# Patient Record
Sex: Female | Born: 1975
Health system: Southern US, Community
[De-identification: ages and names within clinical notes are randomized; demographics above are authoritative.]

## PROBLEM LIST (undated history)

## (undated) DIAGNOSIS — Z309 Encounter for contraceptive management, unspecified: Secondary | ICD-10-CM

## (undated) DIAGNOSIS — N390 Urinary tract infection, site not specified: Secondary | ICD-10-CM

## (undated) DIAGNOSIS — B001 Herpesviral vesicular dermatitis: Secondary | ICD-10-CM

## (undated) DIAGNOSIS — Z9889 Other specified postprocedural states: Secondary | ICD-10-CM

## (undated) DIAGNOSIS — R112 Nausea with vomiting, unspecified: Secondary | ICD-10-CM

## (undated) DIAGNOSIS — G43909 Migraine, unspecified, not intractable, without status migrainosus: Secondary | ICD-10-CM

## (undated) DIAGNOSIS — N6310 Unspecified lump in the right breast, unspecified quadrant: Secondary | ICD-10-CM

## (undated) HISTORY — PX: BREAST BIOPSY: SHX20

## (undated) HISTORY — DX: Encounter for contraceptive management, unspecified: Z30.9

## (undated) HISTORY — DX: Urinary tract infection, site not specified: N39.0

## (undated) HISTORY — DX: Migraine, unspecified, not intractable, without status migrainosus: G43.909

## (undated) HISTORY — DX: Herpesviral vesicular dermatitis: B00.1

## (undated) HISTORY — DX: Unspecified lump in the right breast, unspecified quadrant: N63.10

---

## 2008-05-23 ENCOUNTER — Other Ambulatory Visit: Admission: RE | Admit: 2008-05-23 | Discharge: 2008-05-23 | Payer: Self-pay | Admitting: Obstetrics and Gynecology

## 2009-01-17 DIAGNOSIS — C4491 Basal cell carcinoma of skin, unspecified: Secondary | ICD-10-CM

## 2009-01-17 HISTORY — DX: Basal cell carcinoma of skin, unspecified: C44.91

## 2009-07-13 ENCOUNTER — Other Ambulatory Visit: Admission: RE | Admit: 2009-07-13 | Discharge: 2009-07-13 | Payer: Self-pay | Admitting: Obstetrics and Gynecology

## 2009-07-14 ENCOUNTER — Encounter: Admission: RE | Admit: 2009-07-14 | Discharge: 2009-07-14 | Payer: Self-pay | Admitting: Obstetrics & Gynecology

## 2009-07-17 ENCOUNTER — Encounter: Admission: RE | Admit: 2009-07-17 | Discharge: 2009-07-17 | Payer: Self-pay | Admitting: Obstetrics & Gynecology

## 2010-01-08 ENCOUNTER — Encounter: Admission: RE | Admit: 2010-01-08 | Discharge: 2010-01-08 | Payer: Self-pay | Admitting: Obstetrics & Gynecology

## 2010-08-27 ENCOUNTER — Other Ambulatory Visit: Admission: RE | Admit: 2010-08-27 | Discharge: 2010-08-27 | Payer: Self-pay | Admitting: Obstetrics and Gynecology

## 2011-09-19 ENCOUNTER — Other Ambulatory Visit (HOSPITAL_COMMUNITY)
Admission: RE | Admit: 2011-09-19 | Discharge: 2011-09-19 | Disposition: A | Discharge status: Home / Self Care | Payer: BC Managed Care – PPO | Source: Ambulatory Visit | Attending: Obstetrics and Gynecology | Admitting: Obstetrics and Gynecology

## 2011-09-19 ENCOUNTER — Other Ambulatory Visit: Payer: Self-pay | Admitting: Obstetrics & Gynecology

## 2011-09-19 ENCOUNTER — Other Ambulatory Visit: Payer: Self-pay | Admitting: Adult Health

## 2011-09-19 DIAGNOSIS — Z01419 Encounter for gynecological examination (general) (routine) without abnormal findings: Secondary | ICD-10-CM | POA: Insufficient documentation

## 2011-09-19 DIAGNOSIS — N63 Unspecified lump in unspecified breast: Secondary | ICD-10-CM

## 2011-10-07 ENCOUNTER — Other Ambulatory Visit: Payer: Self-pay

## 2011-10-22 ENCOUNTER — Ambulatory Visit
Admission: RE | Admit: 2011-10-22 | Discharge: 2011-10-22 | Disposition: A | Discharge status: Home / Self Care | Payer: BC Managed Care – PPO | Source: Ambulatory Visit | Attending: Obstetrics & Gynecology | Admitting: Obstetrics & Gynecology

## 2011-10-22 DIAGNOSIS — N63 Unspecified lump in unspecified breast: Secondary | ICD-10-CM

## 2012-10-13 ENCOUNTER — Other Ambulatory Visit: Payer: Self-pay | Admitting: Adult Health

## 2012-10-13 ENCOUNTER — Other Ambulatory Visit (HOSPITAL_COMMUNITY)
Admission: RE | Admit: 2012-10-13 | Discharge: 2012-10-13 | Disposition: A | Discharge status: Home / Self Care | Payer: BC Managed Care – PPO | Source: Ambulatory Visit | Attending: Obstetrics and Gynecology | Admitting: Obstetrics and Gynecology

## 2012-10-13 DIAGNOSIS — Z01419 Encounter for gynecological examination (general) (routine) without abnormal findings: Secondary | ICD-10-CM | POA: Insufficient documentation

## 2012-10-13 DIAGNOSIS — Z1151 Encounter for screening for human papillomavirus (HPV): Secondary | ICD-10-CM | POA: Insufficient documentation

## 2013-04-21 ENCOUNTER — Telehealth: Payer: Self-pay | Admitting: Adult Health

## 2013-04-21 MED ORDER — NORETHINDRONE 0.35 MG PO TABS: 1.0000 | ORAL_TABLET | Freq: Every day | ORAL | Status: DC

## 2013-04-21 NOTE — Telephone Encounter (Signed)
Will refill camilla

## 2013-07-08 DIAGNOSIS — C4491 Basal cell carcinoma of skin, unspecified: Secondary | ICD-10-CM

## 2013-07-08 HISTORY — DX: Basal cell carcinoma of skin, unspecified: C44.91

## 2013-08-24 ENCOUNTER — Ambulatory Visit (INDEPENDENT_AMBULATORY_CARE_PROVIDER_SITE_OTHER): Payer: BC Managed Care – PPO | Admitting: Adult Health

## 2013-08-24 ENCOUNTER — Encounter: Payer: Self-pay | Admitting: Adult Health

## 2013-08-24 VITALS — BP 110/70 | Ht 65.0 in | Wt 114.0 lb

## 2013-08-24 DIAGNOSIS — N63 Unspecified lump in unspecified breast: Secondary | ICD-10-CM

## 2013-08-24 DIAGNOSIS — N6310 Unspecified lump in the right breast, unspecified quadrant: Secondary | ICD-10-CM | POA: Insufficient documentation

## 2013-08-24 HISTORY — DX: Unspecified lump in the right breast, unspecified quadrant: N63.10

## 2013-08-24 NOTE — Patient Instructions (Addendum)
Follow up prn Get mammogram and [place]

## 2013-08-24 NOTE — Progress Notes (Signed)
Subjective:     Patient ID: Kathleen Hughes, female   DOB: [birth year], 37 y.o.   MRN: [id number]  HPI Kathleen Hughes has noticed a right breast mass x about 3 weeks.  Review of Systems Positives in HPI Reviewed past medical,surgical, social and family history. Reviewed medications and allergies.     Objective:   Physical Exam BP 110/70  Ht 5\' 5"  (1.651 m)  Wt 114 lb (51.71 kg)  BMI 18.97 kg/m2    Skin warm and dry,  Breasts:no dominate palpable mass, retraction or nipple discharge on the left, on the right there is a round mass at 12-1 o'clock 2 finger breath from areola and 1 at 3 o'clock 1 finger breath from areola,no retraction or nipple discharge.  Assessment:     Right breast mass x 2    Plan:     Bilateral diagnostic mammogram and right breast US at the breast center 9/18 at 8:30 am Follow up prn

## 2013-09-02 ENCOUNTER — Ambulatory Visit
Admission: RE | Admit: 2013-09-02 | Discharge: 2013-09-02 | Disposition: A | Discharge status: Home / Self Care | Payer: BC Managed Care – PPO | Source: Ambulatory Visit | Attending: Adult Health | Admitting: Adult Health

## 2013-09-02 ENCOUNTER — Other Ambulatory Visit: Payer: Self-pay | Admitting: Adult Health

## 2013-09-02 DIAGNOSIS — N6310 Unspecified lump in the right breast, unspecified quadrant: Secondary | ICD-10-CM

## 2013-09-06 ENCOUNTER — Telehealth: Payer: Self-pay | Admitting: Adult Health

## 2013-09-06 NOTE — Telephone Encounter (Signed)
Left message that I was aware that breast biopsy was fibroadenoma

## 2013-10-12 ENCOUNTER — Telehealth: Payer: Self-pay | Admitting: Obstetrics and Gynecology

## 2013-10-12 NOTE — Telephone Encounter (Signed)
She is fine to go 2-3 years on pap and get mail order to fax

## 2014-01-19 ENCOUNTER — Other Ambulatory Visit: Payer: Self-pay | Admitting: Physician Assistant

## 2014-01-19 DIAGNOSIS — D229 Melanocytic nevi, unspecified: Secondary | ICD-10-CM

## 2014-01-19 HISTORY — DX: Melanocytic nevi, unspecified: D22.9

## 2014-04-13 ENCOUNTER — Telehealth: Payer: Self-pay | Admitting: Adult Health

## 2014-04-14 ENCOUNTER — Other Ambulatory Visit: Payer: Self-pay | Admitting: *Deleted

## 2014-04-14 MED ORDER — NORETHINDRONE 0.35 MG PO TABS: 1.0000 | ORAL_TABLET | Freq: Every day | ORAL | Status: DC

## 2014-04-14 NOTE — Telephone Encounter (Signed)
Pt requesting refill on [employer]ron[employer].

## 2014-04-14 NOTE — Telephone Encounter (Signed)
micronoir reordered

## 2014-04-14 NOTE — Telephone Encounter (Signed)
Pt aware Micronor e-scribed.

## 2014-04-15 MED ORDER — NORETHINDRONE 0.35 MG PO TABS: 1.0000 | ORAL_TABLET | Freq: Every day | ORAL | Status: DC

## 2014-10-17 ENCOUNTER — Encounter: Payer: Self-pay | Admitting: Adult Health

## 2015-02-06 ENCOUNTER — Other Ambulatory Visit: Payer: Self-pay | Admitting: Obstetrics & Gynecology

## 2015-02-15 ENCOUNTER — Encounter: Payer: Self-pay | Admitting: Adult Health

## 2015-02-15 ENCOUNTER — Ambulatory Visit (INDEPENDENT_AMBULATORY_CARE_PROVIDER_SITE_OTHER): Payer: BLUE CROSS/BLUE SHIELD | Admitting: Adult Health

## 2015-02-15 ENCOUNTER — Other Ambulatory Visit (HOSPITAL_COMMUNITY)
Admission: RE | Admit: 2015-02-15 | Discharge: 2015-02-15 | Disposition: A | Discharge status: Home / Self Care | Payer: BLUE CROSS/BLUE SHIELD | Source: Ambulatory Visit | Attending: Adult Health | Admitting: Adult Health

## 2015-02-15 VITALS — BP 102/70 | HR 70 | Ht 65.0 in | Wt 111.5 lb

## 2015-02-15 DIAGNOSIS — N390 Urinary tract infection, site not specified: Secondary | ICD-10-CM | POA: Diagnosis not present

## 2015-02-15 DIAGNOSIS — R350 Frequency of micturition: Secondary | ICD-10-CM

## 2015-02-15 DIAGNOSIS — Z1151 Encounter for screening for human papillomavirus (HPV): Secondary | ICD-10-CM | POA: Diagnosis present

## 2015-02-15 DIAGNOSIS — Z01419 Encounter for gynecological examination (general) (routine) without abnormal findings: Secondary | ICD-10-CM | POA: Insufficient documentation

## 2015-02-15 DIAGNOSIS — Z309 Encounter for contraceptive management, unspecified: Secondary | ICD-10-CM

## 2015-02-15 DIAGNOSIS — Z3041 Encounter for surveillance of contraceptive pills: Secondary | ICD-10-CM

## 2015-02-15 DIAGNOSIS — Z1212 Encounter for screening for malignant neoplasm of rectum: Secondary | ICD-10-CM

## 2015-02-15 HISTORY — DX: Urinary tract infection, site not specified: N39.0

## 2015-02-15 HISTORY — DX: Encounter for contraceptive management, unspecified: Z30.9

## 2015-02-15 LAB — POCT URINALYSIS DIPSTICK
GLUCOSE UA: NEGATIVE
KETONES UA: NEGATIVE
LEUKOCYTES UA: NEGATIVE
Nitrite, UA: POSITIVE
Protein, UA: NEGATIVE

## 2015-02-15 LAB — HEMOCCULT GUIAC POC 1CARD (OFFICE): FECAL OCCULT BLD: NEGATIVE

## 2015-02-15 MED ORDER — SULFAMETHOXAZOLE-TRIMETHOPRIM 800-160 MG PO TABS: 1.0000 | ORAL_TABLET | Freq: Two times a day (BID) | ORAL | Status: DC

## 2015-02-15 MED ORDER — NORETHINDRONE 0.35 MG PO TABS: ORAL_TABLET | ORAL | Status: DC

## 2015-02-15 NOTE — Progress Notes (Signed)
Patient ID: Kathleen Hughes, female   DOB: May 01, 1976, 39 y.o.   MRN: [id number] History of Present Illness: Kathleen Hughes is a 39 year old white female,married in for well woman gyn exam and pap.She is complaining of urinary frequency.   Current Medications, Allergies, Past Medical History, Past Surgical History, Family History and Social History were reviewed in [employer] record.     Review of Systems: Patient denies any headaches, hearing loss, fatigue, blurred vision, shortness of breath, chest pain, abdominal pain, problems with bowel movements,  or intercourse. No joint pain or mood swings. See HPI for positives.   Physical Exam:BP 102/70 mmHg  Pulse 70  Ht 5\' 5"  (1.651 m)  Wt 111 lb 8 oz (50.576 kg)  BMI 18.55 kg/m2  LMP 02/01/2015 (Approximate)Urine 3+blood and + nitrates General:  Well developed, well nourished, no acute distress Skin:  Warm and dry Neck:  Midline trachea, normal thyroid, good ROM, no lymphadenopathy Lungs; Clear to auscultation bilaterally Breast:  No dominant palpable mass, retraction, or nipple discharge Cardiovascular: Regular rate and rhythm Abdomen:  Soft, non tender, no hepatosplenomegaly Pelvic:  External genitalia is normal in appearance, no lesions.  The vagina is normal in appearance, with good color, moisture and rugae. Urethra has no lesions or masses. The cervix is bulbous,pap with HPV performed.  Uterus is felt to be normal size, shape, and contour.  No adnexal masses or tenderness noted.Bladder is non tender, no masses felt. Rectal: Good sphincter tone, no polyps, or hemorrhoids felt.  Hemoccult negative. Extremities/musculoskeletal:  No swelling or varicosities noted, no clubbing or cyanosis Psych:  No mood changes, alert and cooperative,seems happy She is trying to decide if she wants another child or not, I encouraged her to decide as she is 59, and it can take longer and there are increased risks with  age.  Impression: Well woman gyn exam with pap Contraceptive management Urinary frequency UTI     Plan: Refilled Nora-Be x 1 year take 1 daily Rx septra ds #14 1 bid x 7 days  Push fluids Review handout on UTI Check CBC,CMP,TSH and lipids UA C&S sent Physical in 1 year, pap in 3 if normal Get mammogram now and yearly Call when decides about getting pregnant or not, will rx prenatal vitamins then

## 2015-02-15 NOTE — Patient Instructions (Signed)
Physical in 1 year Mammogram now and yearly  Will talk when labs back Urinary Tract Infection Urinary tract infections (UTIs) can develop anywhere along your urinary tract. Your urinary tract is your body's drainage system for removing wastes and extra water. Your urinary tract includes two kidneys, two ureters, a bladder, and a urethra. Your kidneys are a pair of bean-shaped organs. Each kidney is about the size of your fist. They are located below your ribs, one on each side of your spine. CAUSES Infections are caused by microbes, which are microscopic organisms, including fungi, viruses, and bacteria. These organisms are so small that they can only be seen through a microscope. Bacteria are the microbes that most commonly cause UTIs. SYMPTOMS  Symptoms of UTIs may vary by age and gender of the patient and by the location of the infection. Symptoms in young women typically include a frequent and intense urge to urinate and a painful, burning feeling in the bladder or urethra during urination. Older women and men are more likely to be tired, shaky, and weak and have muscle aches and abdominal pain. A fever may mean the infection is in your kidneys. Other symptoms of a kidney infection include pain in your back or sides below the ribs, nausea, and vomiting. DIAGNOSIS To diagnose a UTI, your caregiver will ask you about your symptoms. Your caregiver also will ask to provide a urine sample. The urine sample will be tested for bacteria and white blood cells. White blood cells are made by your body to help fight infection. TREATMENT  Typically, UTIs can be treated with medication. Because most UTIs are caused by a bacterial infection, they usually can be treated with the use of antibiotics. The choice of antibiotic and length of treatment depend on your symptoms and the type of bacteria causing your infection. HOME CARE INSTRUCTIONS  If you were prescribed antibiotics, take them exactly as your caregiver  instructs you. Finish the medication even if you feel better after you have only taken some of the medication.  Drink enough water and fluids to keep your urine clear or pale yellow.  Avoid caffeine, tea, and carbonated beverages. They tend to irritate your bladder.  Empty your bladder often. Avoid holding urine for long periods of time.  Empty your bladder before and after sexual intercourse.  After a bowel movement, women should cleanse from front to back. Use each tissue only once. SEEK MEDICAL CARE IF:   You have back pain.  You develop a fever.  Your symptoms do not begin to resolve within 3 days. SEEK IMMEDIATE MEDICAL CARE IF:   You have severe back pain or lower abdominal pain.  You develop chills.  You have nausea or vomiting.  You have continued burning or discomfort with urination. MAKE SURE YOU:   Understand these instructions.  Will watch your condition.  Will get help right away if you are not doing well or get worse. Document Released: 09/11/2005 Document Revised: 06/02/2012 Document Reviewed: 01/10/2012 [facility] Patient Information 2015 [place], [place]. This information is not intended to replace advice given to you by your health care provider. Make sure you discuss any questions you have with your health care provider.

## 2015-02-16 LAB — LIPID PANEL
CHOL/HDL RATIO: 3.3 ratio (ref 0.0–4.4)
CHOLESTEROL TOTAL: 169 mg/dL (ref 100–199)
HDL: 52 mg/dL (ref >39)
LDL CALC: 98 mg/dL (ref 0–99)
TRIGLYCERIDES: 93 mg/dL (ref 0–149)
VLDL Cholesterol Cal: 19 mg/dL (ref 5–40)

## 2015-02-16 LAB — URINALYSIS, ROUTINE W REFLEX MICROSCOPIC
BILIRUBIN UA: NEGATIVE
Glucose, UA: NEGATIVE
Ketones, UA: NEGATIVE
Nitrite, UA: POSITIVE — AB
PH UA: 7 (ref 5.0–7.5)
PROTEIN UA: NEGATIVE
Specific Gravity, UA: 1.02 (ref 1.005–1.030)
Urobilinogen, Ur: 0.2 mg/dL (ref 0.2–1.0)

## 2015-02-16 LAB — CBC
HEMATOCRIT: 41.3 % (ref 34.0–46.6)
HEMOGLOBIN: 14.2 g/dL (ref 11.1–15.9)
MCH: 32.8 pg (ref 26.6–33.0)
MCHC: 34.4 g/dL (ref 31.5–35.7)
MCV: 95 fL (ref 79–97)
Platelets: 283 10*3/uL (ref 150–379)
RBC: 4.33 x10E6/uL (ref 3.77–5.28)
RDW: 12.3 % (ref 12.3–15.4)
WBC: 4.9 10*3/uL (ref 3.4–10.8)

## 2015-02-16 LAB — COMPREHENSIVE METABOLIC PANEL
A/G RATIO: 1.9 (ref 1.1–2.5)
ALBUMIN: 5 g/dL (ref 3.5–5.5)
ALT: 9 IU/L (ref 0–32)
AST: 12 IU/L (ref 0–40)
Alkaline Phosphatase: 73 IU/L (ref 39–117)
BILIRUBIN TOTAL: 0.5 mg/dL (ref 0.0–1.2)
BUN/Creatinine Ratio: 10 (ref 9–23)
BUN: 8 mg/dL (ref 6–24)
CALCIUM: 9.6 mg/dL (ref 8.7–10.2)
CO2: 24 mmol/L (ref 18–29)
CREATININE: 0.77 mg/dL (ref 0.57–1.00)
Chloride: 99 mmol/L (ref 97–108)
GFR, EST AFRICAN AMERICAN: 112 mL/min/{1.73_m2} (ref >59)
GFR, EST NON AFRICAN AMERICAN: 97 mL/min/{1.73_m2} (ref >59)
GLOBULIN, TOTAL: 2.6 g/dL (ref 1.5–4.5)
GLUCOSE: 92 mg/dL (ref 65–99)
Potassium: 4.1 mmol/L (ref 3.5–5.2)
Sodium: 138 mmol/L (ref 134–144)
TOTAL PROTEIN: 7.6 g/dL (ref 6.0–8.5)

## 2015-02-16 LAB — CYTOLOGY - PAP

## 2015-02-16 LAB — MICROSCOPIC EXAMINATION: Casts: NONE SEEN /lpf

## 2015-02-16 LAB — TSH: TSH: 1.66 u[IU]/mL (ref 0.450–4.500)

## 2015-02-17 LAB — URINE CULTURE

## 2015-03-15 ENCOUNTER — Other Ambulatory Visit: Payer: Self-pay | Admitting: Obstetrics & Gynecology

## 2015-03-15 DIAGNOSIS — N6310 Unspecified lump in the right breast, unspecified quadrant: Secondary | ICD-10-CM

## 2015-03-21 ENCOUNTER — Ambulatory Visit
Admission: RE | Admit: 2015-03-21 | Discharge: 2015-03-21 | Disposition: A | Discharge status: Home / Self Care | Payer: BLUE CROSS/BLUE SHIELD | Source: Ambulatory Visit | Attending: Obstetrics & Gynecology | Admitting: Obstetrics & Gynecology

## 2015-03-21 ENCOUNTER — Encounter (INDEPENDENT_AMBULATORY_CARE_PROVIDER_SITE_OTHER): Payer: Self-pay

## 2015-03-21 DIAGNOSIS — N6310 Unspecified lump in the right breast, unspecified quadrant: Secondary | ICD-10-CM

## 2016-02-05 ENCOUNTER — Telehealth: Payer: Self-pay | Admitting: Adult Health

## 2016-02-05 NOTE — Telephone Encounter (Signed)
Refilled Kathleen Hughes be x 1 year

## 2016-02-20 ENCOUNTER — Encounter: Payer: Self-pay | Admitting: Adult Health

## 2016-02-20 ENCOUNTER — Ambulatory Visit (INDEPENDENT_AMBULATORY_CARE_PROVIDER_SITE_OTHER): Payer: BLUE CROSS/BLUE SHIELD | Admitting: Adult Health

## 2016-02-20 VITALS — BP 100/60 | HR 80 | Ht 65.0 in | Wt 115.0 lb

## 2016-02-20 DIAGNOSIS — Z1211 Encounter for screening for malignant neoplasm of colon: Secondary | ICD-10-CM

## 2016-02-20 DIAGNOSIS — Z01419 Encounter for gynecological examination (general) (routine) without abnormal findings: Secondary | ICD-10-CM

## 2016-02-20 DIAGNOSIS — Z3041 Encounter for surveillance of contraceptive pills: Secondary | ICD-10-CM

## 2016-02-20 LAB — HEMOCCULT GUIAC POC 1CARD (OFFICE): FECAL OCCULT BLD: NEGATIVE

## 2016-02-20 MED ORDER — NORETHINDRONE 0.35 MG PO TABS: ORAL_TABLET | ORAL | Status: DC

## 2016-02-20 NOTE — Patient Instructions (Signed)
Mammogram yearly Physical in 1 year

## 2016-02-20 NOTE — Progress Notes (Signed)
Patient ID: Kathleen Hughes, female   DOB: 17-Jun-1976, 40 y.o.   MRN: [id number] History of Present Illness: Kathleen Hughes is a 40 year old white female, married, in for a well woman gyn exam, she had a normal pap with negative HPV 02/15/15. She is happy with her OCs.   Current Medications, Allergies, Past Medical History, Past Surgical History, Family History and Social History were reviewed in [employer] record.     Review of Systems: Patient denies any headaches, hearing loss, fatigue, blurred vision, shortness of breath, chest pain, abdominal pain, problems with bowel movements, urination, or intercourse. No joint pain or mood swings.    Physical Exam:BP 100/60 mmHg  Pulse 80  Ht '5\' 5"'$  (1.651 m)  Wt 115 lb (52.164 kg)  BMI 19.14 kg/m2  LMP 02/08/2016 (Approximate) General:  Well developed, well nourished, no acute distress Skin:  Warm and dry Neck:  Midline trachea, normal thyroid, good ROM, no lymphadenopathy Lungs; Clear to auscultation bilaterally Breast:  No dominant palpable mass, retraction, or nipple discharge Cardiovascular: Regular rate and rhythm Abdomen:  Soft, non tender, no hepatosplenomegaly Pelvic:  External genitalia is normal in appearance, no lesions.  The vagina is normal in appearance. Urethra has no lesions or masses. The cervix is smooth.  Uterus is felt to be normal size, shape, and contour.  No adnexal masses or tenderness noted.Bladder is non tender, no masses felt. Rectal: Good sphincter tone, no polyps, or hemorrhoids felt.  Hemoccult negative. Extremities/musculoskeletal:  No swelling or varicosities noted, no clubbing or cyanosis Psych:  No mood changes, alert and cooperative,seems happy She had normal fasting labs last year, will not repeat today.  Impression: Well woman gyn exam no pap Contraceptive management    Plan: Refilled Nora-Be 0.35 mg Disp #84 with 4 refills Mammogram yearly Physical in 1 year, pap in 2019

## 2016-05-21 ENCOUNTER — Other Ambulatory Visit: Payer: Self-pay | Admitting: Adult Health

## 2016-05-21 MED ORDER — NORETHINDRONE 0.35 MG PO TABS: ORAL_TABLET | ORAL | Status: DC

## 2016-07-02 ENCOUNTER — Other Ambulatory Visit: Payer: Self-pay | Admitting: Obstetrics & Gynecology

## 2016-07-02 DIAGNOSIS — Z1231 Encounter for screening mammogram for malignant neoplasm of breast: Secondary | ICD-10-CM

## 2016-08-13 ENCOUNTER — Ambulatory Visit: Payer: BLUE CROSS/BLUE SHIELD

## 2016-09-10 ENCOUNTER — Ambulatory Visit
Admission: RE | Admit: 2016-09-10 | Discharge: 2016-09-10 | Disposition: A | Discharge status: Home / Self Care | Payer: BLUE CROSS/BLUE SHIELD | Source: Ambulatory Visit | Attending: Obstetrics & Gynecology | Admitting: Obstetrics & Gynecology

## 2016-09-10 DIAGNOSIS — Z1231 Encounter for screening mammogram for malignant neoplasm of breast: Secondary | ICD-10-CM

## 2017-02-25 ENCOUNTER — Other Ambulatory Visit: Payer: BLUE CROSS/BLUE SHIELD | Admitting: Adult Health

## 2017-03-24 ENCOUNTER — Other Ambulatory Visit (HOSPITAL_COMMUNITY)
Admission: RE | Admit: 2017-03-24 | Discharge: 2017-03-24 | Disposition: A | Discharge status: Home / Self Care | Payer: BLUE CROSS/BLUE SHIELD | Source: Ambulatory Visit | Attending: Adult Health | Admitting: Adult Health

## 2017-03-24 ENCOUNTER — Ambulatory Visit (INDEPENDENT_AMBULATORY_CARE_PROVIDER_SITE_OTHER): Payer: BLUE CROSS/BLUE SHIELD | Admitting: Adult Health

## 2017-03-24 ENCOUNTER — Encounter: Payer: Self-pay | Admitting: Adult Health

## 2017-03-24 VITALS — BP 118/66 | HR 72 | Ht 65.25 in | Wt 120.0 lb

## 2017-03-24 DIAGNOSIS — Z3041 Encounter for surveillance of contraceptive pills: Secondary | ICD-10-CM

## 2017-03-24 DIAGNOSIS — B001 Herpesviral vesicular dermatitis: Secondary | ICD-10-CM

## 2017-03-24 DIAGNOSIS — Z1212 Encounter for screening for malignant neoplasm of rectum: Secondary | ICD-10-CM

## 2017-03-24 DIAGNOSIS — Z1211 Encounter for screening for malignant neoplasm of colon: Secondary | ICD-10-CM

## 2017-03-24 DIAGNOSIS — Z01419 Encounter for gynecological examination (general) (routine) without abnormal findings: Secondary | ICD-10-CM | POA: Insufficient documentation

## 2017-03-24 LAB — HEMOCCULT GUIAC POC 1CARD (OFFICE): Fecal Occult Blood, POC: NEGATIVE

## 2017-03-24 MED ORDER — NORETHINDRONE 0.35 MG PO TABS: ORAL_TABLET | ORAL | 4 refills | Status: DC

## 2017-03-24 MED ORDER — VALACYCLOVIR HCL 1 G PO TABS: ORAL_TABLET | ORAL | 1 refills | Status: DC

## 2017-03-24 NOTE — Progress Notes (Signed)
Patient ID: Kathleen Hughes, female   DOB: 1976/01/22, 41 y.o.   MRN: [id number] History of Present Illness: Kathleen Hughes is a 41 year old white female, married in for well woman gyn exam and pap. PCP is [facility].   Current Medications, Allergies, Past Medical History, Past Surgical History, Family History and Social History were reviewed in [employer] record.     Review of Systems: Patient denies daily headaches(but are more frequent), hearing loss, fatigue, blurred vision, shortness of breath, chest pain, abdominal pain, problems with bowel movements, urination, or intercourse. No joint pain or mood swings.Has more frequent cold sores too. She has trouble going to sleep and used tylenol PM and melatonin.  Has are in left breast that can feel from time to time.   Physical Exam:BP 118/66 (BP Location: Right Arm, Patient Position: Sitting, Cuff Size: Normal)   Pulse 72   Ht 5' 5.25" (1.657 m)   Wt 120 lb (54.4 kg)   LMP 03/16/2017   BMI 19.82 kg/m  General:  Well developed, well nourished, no acute distress Skin:  Warm and dry,has cold sore  Neck:  Midline trachea, normal thyroid, good ROM, no lymphadenopathy Lungs; Clear to auscultation bilaterally Breast:  No dominant palpable mass, retraction, or nipple discharge, has dense tissue and regular irregularities esp left breast, has known fibroadenoma at 11 o'clock left breast from [place] 2012 Cardiovascular: Regular rate and rhythm Abdomen:  Soft, non tender, no hepatosplenomegaly Pelvic:  External genitalia is normal in appearance, no lesions.  The vagina is normal in appearance. Urethra has no lesions or masses. The cervix is bulbous.Pap with GC/CHL and HPV performed.  Uterus is felt to be normal size, shape, and contour.  No adnexal masses or tenderness noted.Bladder is non tender, no masses felt. Rectal: Good sphincter tone, no polyps, or hemorrhoids felt.  Hemoccult negative. Extremities/musculoskeletal:  No swelling  or varicosities noted, no clubbing or cyanosis Psych:  No mood changes, alert and cooperative,seems happy PHQ 2 score 0.  Impression: 1. Pap smear, as part of routine gynecological examination   2. Encounter for surveillance of contraceptive pills   3. Screening for colorectal cancer   4. Recurrent cold sores       Plan: Meds ordered this encounter  Medications  . norethindrone (NORA-BE) 0.35 MG tablet    Sig: TAKE 1 BY MOUTH DAILY    Dispense:  84 Package    Refill:  4    Order Specific Question:   Supervising Provider    Answer:   EURE, LUTHER H [[id number]]  . valACYclovir (VALTREX) 1000 MG tablet    Sig: Take 2 bid x 1 day for cold sore    Dispense:  10 tablet    Refill:  1    Order Specific Question:   Supervising Provider    Answer:   Florian Buff [[id number]]   Physical in 1 year, pap in 3 if normal Mammogram yearly Fasting labs next year

## 2017-03-27 LAB — CYTOLOGY - PAP
Chlamydia: NEGATIVE
Diagnosis: NEGATIVE
HPV: NOT DETECTED
Neisseria Gonorrhea: NEGATIVE

## 2017-06-03 DIAGNOSIS — D229 Melanocytic nevi, unspecified: Secondary | ICD-10-CM | POA: Diagnosis not present

## 2017-06-03 DIAGNOSIS — L821 Other seborrheic keratosis: Secondary | ICD-10-CM | POA: Diagnosis not present

## 2017-06-03 DIAGNOSIS — L603 Nail dystrophy: Secondary | ICD-10-CM | POA: Diagnosis not present

## 2017-06-03 DIAGNOSIS — B351 Tinea unguium: Secondary | ICD-10-CM | POA: Diagnosis not present

## 2017-07-09 DIAGNOSIS — L089 Local infection of the skin and subcutaneous tissue, unspecified: Secondary | ICD-10-CM | POA: Diagnosis not present

## 2017-09-29 ENCOUNTER — Telehealth: Payer: Self-pay | Admitting: Adult Health

## 2017-09-29 NOTE — Telephone Encounter (Signed)
Patient called with complaints of cramping and abdominal pain that has been coming and going for about 2 months. The pain is not unbearable but she is just concerned and doesn't know if she needs to be seen. She is taking a BCP in which she thought was she wasn't supposed to have a period but she is having irregular dark bleeding. Please advise.

## 2017-09-29 NOTE — Telephone Encounter (Signed)
Informed patient that Anderson Malta would like for her to have an U/S in the office. Pt stated she would like to make an appointment.

## 2017-09-30 ENCOUNTER — Other Ambulatory Visit (INDEPENDENT_AMBULATORY_CARE_PROVIDER_SITE_OTHER): Payer: BLUE CROSS/BLUE SHIELD

## 2017-09-30 ENCOUNTER — Other Ambulatory Visit: Payer: Self-pay | Admitting: Adult Health

## 2017-09-30 ENCOUNTER — Encounter (INDEPENDENT_AMBULATORY_CARE_PROVIDER_SITE_OTHER): Payer: Self-pay

## 2017-09-30 DIAGNOSIS — N926 Irregular menstruation, unspecified: Secondary | ICD-10-CM | POA: Diagnosis not present

## 2017-09-30 NOTE — Progress Notes (Signed)
PELVIC US TA/TV:homogeneous retroflexed uterus,EEC 1.9 mm,normal right ovary,simple left ovarian cyst 2.9 x 2.2 x 2.3 cm,no free fluid ,ovaries appear mobile,left adnexal discomfort during ultrasound

## 2017-10-01 ENCOUNTER — Telehealth: Payer: Self-pay | Admitting: Adult Health

## 2017-10-01 NOTE — Telephone Encounter (Signed)
Pt aware [place] normal with simple cyst left ovary, continue POP.

## 2017-12-17 ENCOUNTER — Other Ambulatory Visit: Payer: Self-pay | Admitting: Obstetrics & Gynecology

## 2017-12-17 DIAGNOSIS — Z1231 Encounter for screening mammogram for malignant neoplasm of breast: Secondary | ICD-10-CM

## 2018-01-08 ENCOUNTER — Ambulatory Visit
Admission: RE | Admit: 2018-01-08 | Discharge: 2018-01-08 | Disposition: A | Discharge status: Home / Self Care | Payer: BLUE CROSS/BLUE SHIELD | Source: Ambulatory Visit | Attending: Obstetrics & Gynecology | Admitting: Obstetrics & Gynecology

## 2018-01-08 DIAGNOSIS — Z1231 Encounter for screening mammogram for malignant neoplasm of breast: Secondary | ICD-10-CM

## 2018-03-19 ENCOUNTER — Ambulatory Visit: Payer: BLUE CROSS/BLUE SHIELD | Admitting: Women's Health

## 2018-03-19 ENCOUNTER — Encounter: Payer: Self-pay | Admitting: Women's Health

## 2018-03-19 VITALS — BP 110/80 | HR 96 | Ht 64.0 in | Wt 122.2 lb

## 2018-03-19 DIAGNOSIS — N898 Other specified noninflammatory disorders of vagina: Secondary | ICD-10-CM

## 2018-03-19 DIAGNOSIS — N760 Acute vaginitis: Secondary | ICD-10-CM

## 2018-03-19 DIAGNOSIS — R102 Pelvic and perineal pain: Secondary | ICD-10-CM | POA: Diagnosis not present

## 2018-03-19 DIAGNOSIS — B9689 Other specified bacterial agents as the cause of diseases classified elsewhere: Secondary | ICD-10-CM | POA: Diagnosis not present

## 2018-03-19 LAB — POCT WET PREP (WET MOUNT)
Clue Cells Wet Prep Whiff POC: POSITIVE
Trichomonas Wet Prep HPF POC: ABSENT

## 2018-03-19 MED ORDER — METRONIDAZOLE 500 MG PO TABS: 500.0000 mg | ORAL_TABLET | Freq: Two times a day (BID) | ORAL | 0 refills | Status: DC

## 2018-03-19 NOTE — Patient Instructions (Signed)
Bacterial Vaginosis Bacterial vaginosis is a vaginal infection that occurs when the normal balance of bacteria in the vagina is disrupted. It results from an overgrowth of certain bacteria. This is the most common vaginal infection among women ages 15-44. Because bacterial vaginosis increases your risk for STIs (sexually transmitted infections), getting treated can help reduce your risk for chlamydia, gonorrhea, herpes, and HIV (human immunodeficiency virus). Treatment is also important for preventing complications in pregnant women, because this condition can cause an early (premature) delivery. What are the causes? This condition is caused by an increase in harmful bacteria that are normally present in small amounts in the vagina. However, the reason that the condition develops is not fully understood. What increases the risk? The following factors may make you more likely to develop this condition:  Having a new sexual partner or multiple sexual partners.  Having unprotected sex.  Douching.  Having an intrauterine device (IUD).  Smoking.  Drug and alcohol abuse.  Taking certain antibiotic medicines.  Being pregnant.  You cannot get bacterial vaginosis from toilet seats, bedding, swimming pools, or contact with objects around you. What are the signs or symptoms? Symptoms of this condition include:  Grey or white vaginal discharge. The discharge can also be watery or foamy.  A fish-like odor with discharge, especially after sexual intercourse or during menstruation.  Itching in and around the vagina.  Burning or pain with urination.  Some women with bacterial vaginosis have no signs or symptoms. How is this diagnosed? This condition is diagnosed based on:  Your medical history.  A physical exam of the vagina.  Testing a sample of vaginal fluid under a microscope to look for a large amount of bad bacteria or abnormal cells. Your health care provider may use a cotton swab  or a small wooden spatula to collect the sample.  How is this treated? This condition is treated with antibiotics. These may be given as a pill, a vaginal cream, or a medicine that is put into the vagina (suppository). If the condition comes back after treatment, a second round of antibiotics may be needed. Follow these instructions at home: Medicines  Take over-the-counter and prescription medicines only as told by your health care provider.  Take or use your antibiotic as told by your health care provider. Do not stop taking or using the antibiotic even if you start to feel better. General instructions  If you have a female sexual partner, tell her that you have a vaginal infection. She should see her health care provider and be treated if she has symptoms. If you have a female sexual partner, he does not need treatment.  During treatment: ? Avoid sexual activity until you finish treatment. ? Do not douche. ? Avoid alcohol as directed by your health care provider. ? Avoid breastfeeding as directed by your health care provider.  Drink enough water and fluids to keep your urine clear or pale yellow.  Keep the area around your vagina and rectum clean. ? Wash the area daily with warm water. ? Wipe yourself from front to back after using the toilet.  Keep all follow-up visits as told by your health care provider. This is important. How is this prevented?  Do not douche.  Wash the outside of your vagina with warm water only.  Use protection when having sex. This includes latex condoms and dental dams.  Limit how many sexual partners you have. To help prevent bacterial vaginosis, it is best to have sex with just   one partner (monogamous).  Make sure you and your sexual partner are tested for STIs.  Wear cotton or cotton-lined underwear.  Avoid wearing tight pants and pantyhose, especially during summer.  Limit the amount of alcohol that you drink.  Do not use any products that  contain nicotine or tobacco, such as cigarettes and e-cigarettes. If you need help quitting, ask your health care provider.  Do not use illegal drugs. Where to find more information:  Centers for Disease Control and Prevention: www.cdc.gov/std  American Sexual Health Association (ASHA): www.ashastd.org  U.S. Department of Health and Human Services, Office on Women's Health: www.womenshealth.gov/ or https://www.womenshealth.gov/a-z-topics/bacterial-vaginosis Contact a health care provider if:  Your symptoms do not improve, even after treatment.  You have more discharge or pain when urinating.  You have a fever.  You have pain in your abdomen.  You have pain during sex.  You have vaginal bleeding between periods. Summary  Bacterial vaginosis is a vaginal infection that occurs when the normal balance of bacteria in the vagina is disrupted.  Because bacterial vaginosis increases your risk for STIs (sexually transmitted infections), getting treated can help reduce your risk for chlamydia, gonorrhea, herpes, and HIV (human immunodeficiency virus). Treatment is also important for preventing complications in pregnant women, because the condition can cause an early (premature) delivery.  This condition is treated with antibiotic medicines. These may be given as a pill, a vaginal cream, or a medicine that is put into the vagina (suppository). This information is not intended to replace advice given to you by your health care provider. Make sure you discuss any questions you have with your health care provider. Document Released: 12/02/2005 Document Revised: 04/07/2017 Document Reviewed: 08/17/2016 Elsevier Interactive Patient Education  2018 [employer]  

## 2018-03-19 NOTE — Progress Notes (Signed)
   GYN VISIT Patient name: Kathleen Hughes MRN [id number]  Date of birth: 09-30-1976 Chief Complaint:   Vaginal Discharge  History of Present Illness:   Kathleen Hughes is a 42 y.o. G77P1 Caucasian female being seen today for report of greyish malodorous d/c x 1 week. No itching, irritation. Some abd pressure/discomfort. Was dx w/ ovarian cyst in Oct, not sure if that has anything to do with it. Denies recent change in sex partner, is ok w/ checking gc/ct.      Patient's last menstrual period was 03/02/2018. The current method of family planning is OCP (estrogen/progesterone). Last pap 03/24/17. Results were:  normal w/ -HRHPV Review of Systems:   Pertinent items are noted in HPI Denies fever/chills, dizziness, headaches, visual disturbances, fatigue, shortness of breath, chest pain, abdominal pain, vomiting, abnormal vaginal discharge/itching/odor/irritation, problems with periods, bowel movements, urination, or intercourse unless otherwise stated above.  Pertinent History Reviewed:  Reviewed past medical,surgical, social, obstetrical and family history.  Reviewed problem list, medications and allergies. Physical Assessment:   Vitals:   03/19/18 1424  BP: 110/80  Pulse: 96  Weight: 122 lb 3.2 oz (55.4 kg)  Height: 5\' 4"  (1.626 m)  Body mass index is 20.98 kg/m.       Physical Examination:   General appearance: alert, well appearing, and in no distress  Mental status: alert, oriented to person, place, and time  Skin: warm & dry   Cardiovascular: normal heart rate noted  Respiratory: normal respiratory effort, no distress  Abdomen: soft, non-tender   Pelvic: by Carmine Savoy, SNP VULVA: normal appearing vulva with no masses, tenderness or lesions, VAGINA: normal appearing vagina with normal color and malodorous discharge, no lesions, CERVIX: normal appearing cervix without discharge or lesions  Extremities: no edema   Results for orders placed or performed in visit on 03/19/18  (from the past 24 hour(s))  POCT Wet Prep [facility])   Collection Time: 03/19/18  4:03 PM  Result Value Ref Range   Source Wet Prep POC vaginal    WBC, Wet Prep HPF POC none    Bacteria Wet Prep HPF POC Few Few   BACTERIA WET PREP MORPHOLOGY POC     Clue Cells Wet Prep HPF POC Moderate (A) None   Clue Cells Wet Prep Whiff POC Positive Whiff    Yeast Wet Prep HPF POC None    KOH Wet Prep POC     Trichomonas Wet Prep HPF POC Absent Absent    Assessment & Plan:  1) BV> Rx metronidazole 500mg  BID x 7d for BV, no sex or etoh while taking   2) Pelvic pressure/discomfort> discussed could be related to BV, if not improved after finished metronidazole, let us know, can recheck pelvic u/s to assess ovarian cyst  Meds:  Meds ordered this encounter  Medications  . metroNIDAZOLE (FLAGYL) 500 MG tablet    Sig: Take 1 tablet (500 mg total) by mouth 2 (two) times daily.    Dispense:  14 tablet    Refill:  0    Order Specific Question:   Supervising Provider    Answer:   Tania Ade H [[id number]]    Orders Placed This Encounter  Procedures  . GC/Chlamydia Probe Amp  . POCT Wet Prep [facility])    Return for pt will call us. to schedule physical, and possibly pelvic u/s  Roma Schanz CNM, [facility] 03/19/2018 4:05 PM

## 2018-03-23 ENCOUNTER — Telehealth: Payer: Self-pay | Admitting: Women's Health

## 2018-03-24 ENCOUNTER — Telehealth: Payer: Self-pay | Admitting: Adult Health

## 2018-03-24 NOTE — Telephone Encounter (Signed)
I returned the patient's phone call from Thursday, as I was out of the office on Friday.  The patient stated that her son was in her car and she would return my call shortly.   The patient called and stated that she had a "very unpleasant experience" in the office on Thursday of last week.  She stated that she was examined by a student and the exam was very uncomfortable, to the point that she cried on the exam table.  She then stated that the student did not introduce herself as a Ship broker and that she was unaware of the fact that she was not Wells Guiles, CNM.  The patient also informed me that both Wells Guiles and the student were present during the exam, with the student performing the exam under Joelene Millin Booker's supervision.  She proceeded to tell me that she was uncomfortable all weekend and was unable to go to work today.  She also informed me that she normally schedules appointments with Derrek Monaco and she spoke with her on Thursday after this incident.  The patient stated that she felt that her patient rights had been violated.  I sincerely apologized to the patient for any negative feelings that she may have and told her that we strive to take excellent care of every patient.  I offered an appointment with Derrek Monaco or the provider of her choice and she stated that she was not ready to schedule at this time. I informed the patient that I would follow up with her later this week after speaking with the involved providers/students/staff.  Again, I apologized for her discomfort.
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
## 2018-04-23 DIAGNOSIS — M79671 Pain in right foot: Secondary | ICD-10-CM | POA: Diagnosis not present

## 2018-04-23 DIAGNOSIS — B353 Tinea pedis: Secondary | ICD-10-CM | POA: Diagnosis not present

## 2018-05-18 ENCOUNTER — Other Ambulatory Visit: Payer: Self-pay | Admitting: Adult Health

## 2018-06-04 ENCOUNTER — Telehealth: Payer: Self-pay | Admitting: *Deleted

## 2018-06-04 MED ORDER — NORETHINDRONE 0.35 MG PO TABS: ORAL_TABLET | ORAL | 0 refills | Status: DC

## 2018-06-04 NOTE — Telephone Encounter (Signed)
Ok will send to mail order

## 2018-06-04 NOTE — Telephone Encounter (Signed)
Left message that 90 day supply sent, probably just 1 sent early due to time for physical

## 2018-06-05 ENCOUNTER — Other Ambulatory Visit: Payer: Self-pay | Admitting: Women's Health

## 2018-09-07 ENCOUNTER — Ambulatory Visit (INDEPENDENT_AMBULATORY_CARE_PROVIDER_SITE_OTHER): Payer: BLUE CROSS/BLUE SHIELD | Admitting: Adult Health

## 2018-09-07 ENCOUNTER — Encounter: Payer: Self-pay | Admitting: Adult Health

## 2018-09-07 VITALS — BP 110/73 | HR 84 | Ht 62.25 in | Wt 123.0 lb

## 2018-09-07 DIAGNOSIS — Z01411 Encounter for gynecological examination (general) (routine) with abnormal findings: Secondary | ICD-10-CM | POA: Diagnosis not present

## 2018-09-07 DIAGNOSIS — Z1211 Encounter for screening for malignant neoplasm of colon: Secondary | ICD-10-CM | POA: Diagnosis not present

## 2018-09-07 DIAGNOSIS — N898 Other specified noninflammatory disorders of vagina: Secondary | ICD-10-CM

## 2018-09-07 DIAGNOSIS — N760 Acute vaginitis: Secondary | ICD-10-CM | POA: Diagnosis not present

## 2018-09-07 DIAGNOSIS — B9689 Other specified bacterial agents as the cause of diseases classified elsewhere: Secondary | ICD-10-CM | POA: Insufficient documentation

## 2018-09-07 DIAGNOSIS — Z1212 Encounter for screening for malignant neoplasm of rectum: Secondary | ICD-10-CM

## 2018-09-07 DIAGNOSIS — Z01419 Encounter for gynecological examination (general) (routine) without abnormal findings: Secondary | ICD-10-CM | POA: Insufficient documentation

## 2018-09-07 DIAGNOSIS — Z3041 Encounter for surveillance of contraceptive pills: Secondary | ICD-10-CM | POA: Insufficient documentation

## 2018-09-07 LAB — HEMOCCULT GUIAC POC 1CARD (OFFICE): Fecal Occult Blood, POC: NEGATIVE

## 2018-09-07 LAB — POCT WET PREP (WET MOUNT): CLUE CELLS WET PREP WHIFF POC: POSITIVE

## 2018-09-07 MED ORDER — NORETHINDRONE 0.35 MG PO TABS: ORAL_TABLET | ORAL | 4 refills | Status: DC

## 2018-09-07 MED ORDER — METRONIDAZOLE 500 MG PO TABS: 500.0000 mg | ORAL_TABLET | Freq: Two times a day (BID) | ORAL | 0 refills | Status: DC

## 2018-09-07 NOTE — Progress Notes (Signed)
Patient ID: Kathleen Hughes, female   DOB: 04/22/1976, 42 y.o.   MRN: [id number] History of Present Illness: Shailynn is a 42 year old white female,married, G1P1 in for a well woman gyn exam, she had a normal pap with negative HPV 03/24/17. PCP is [facility].   Current Medications, Allergies, Past Medical History, Past Surgical History, Family History and Social History were reviewed in [employer] record.     Review of Systems: Patient denies any headaches, hearing loss, fatigue, blurred vision, shortness of breath, chest pain, abdominal pain, problems with bowel movements, urination, or intercourse. No joint pain or mood swings. +vaginal odor at times   Physical Exam:BP 110/73 (BP Location: Right Arm, Patient Position: Sitting, Cuff Size: Normal)   Pulse 84   Ht 5' 2.25" (1.581 m)   Wt 123 lb (55.8 kg)   LMP 08/17/2018 (Within Days)   BMI 22.32 kg/m  General:  Well developed, well nourished, no acute distress Skin:  Warm and dry Neck:  Midline trachea, normal thyroid, good ROM, no lymphadenopathy Lungs; Clear to auscultation bilaterally Breast:  No dominant palpable mass, retraction, or nipple discharge Cardiovascular: Regular rate and rhythm Abdomen:  Soft, non tender, no hepatosplenomegaly Pelvic:  External genitalia is normal in appearance, no lesions.  The vagina is normal in appearance, +white creamy discharge with slight odor. Urethra has no lesions or masses. The cervix is bulbous.  Uterus is felt to be normal size, shape, and contour.  No adnexal masses or tenderness noted.Bladder is non tender, no masses felt.Wet prep:few WBCs and +clue cells. Rectal: Good sphincter tone, no polyps, or hemorrhoids felt.  Hemoccult negative. Extremities/musculoskeletal:  No swelling or varicosities noted, no clubbing or cyanosis Psych:  No mood changes, alert and cooperative,seems happy PHQ 2 score 0.  Examination chaperoned by Estill Bamberg Rash LPN.  Impression: 1.  Encounter for well woman exam with routine gynecological exam   2. Vaginal discharge   3. BV (bacterial vaginosis)   4. Screening for colorectal cancer   5. Encounter for surveillance of contraceptive pills   6. Vaginal odor       Plan: Check CBC,CMP,TSH and lipids Meds ordered this encounter  Medications  . norethindrone (NORLYDA) 0.35 MG tablet    Sig: TAKE 1 TABLET BY MOUTH DAILY. GENERIC REPLACING NORA-BE    Dispense:  3 Package    Refill:  4    Order Specific Question:   Supervising Provider    Answer:   EURE, LUTHER H [[id number]]  . metroNIDAZOLE (FLAGYL) 500 MG tablet    Sig: Take 1 tablet (500 mg total) by mouth 2 (two) times daily.    Dispense:  14 tablet    Refill:  0    Order Specific Question:   Supervising Provider    Answer:   Florian Buff [[id number]]  Do use soaps when taking tub bath Physical in 1 year Pap 2021 Mammogram yearly

## 2018-09-22 DIAGNOSIS — Z6821 Body mass index (BMI) 21.0-21.9, adult: Secondary | ICD-10-CM | POA: Diagnosis not present

## 2018-09-22 DIAGNOSIS — N3000 Acute cystitis without hematuria: Secondary | ICD-10-CM | POA: Diagnosis not present

## 2018-11-24 DIAGNOSIS — L409 Psoriasis, unspecified: Secondary | ICD-10-CM | POA: Diagnosis not present

## 2018-11-24 DIAGNOSIS — D229 Melanocytic nevi, unspecified: Secondary | ICD-10-CM | POA: Diagnosis not present

## 2019-01-29 ENCOUNTER — Other Ambulatory Visit: Payer: Self-pay | Admitting: Obstetrics & Gynecology

## 2019-01-29 DIAGNOSIS — Z1231 Encounter for screening mammogram for malignant neoplasm of breast: Secondary | ICD-10-CM

## 2019-03-03 ENCOUNTER — Ambulatory Visit: Payer: BLUE CROSS/BLUE SHIELD

## 2019-04-07 ENCOUNTER — Ambulatory Visit: Payer: BLUE CROSS/BLUE SHIELD

## 2019-04-28 DIAGNOSIS — N760 Acute vaginitis: Secondary | ICD-10-CM | POA: Diagnosis not present

## 2019-05-24 ENCOUNTER — Ambulatory Visit
Admission: RE | Admit: 2019-05-24 | Discharge: 2019-05-24 | Disposition: A | Discharge status: Home / Self Care | Payer: BC Managed Care – PPO | Source: Ambulatory Visit | Attending: Obstetrics & Gynecology | Admitting: Obstetrics & Gynecology

## 2019-05-24 ENCOUNTER — Other Ambulatory Visit: Payer: Self-pay

## 2019-05-24 DIAGNOSIS — Z1231 Encounter for screening mammogram for malignant neoplasm of breast: Secondary | ICD-10-CM

## 2019-07-12 DIAGNOSIS — N390 Urinary tract infection, site not specified: Secondary | ICD-10-CM | POA: Diagnosis not present

## 2019-09-01 ENCOUNTER — Other Ambulatory Visit: Payer: Self-pay | Admitting: Adult Health

## 2019-10-13 ENCOUNTER — Telehealth: Payer: Self-pay | Admitting: *Deleted

## 2019-10-13 NOTE — Telephone Encounter (Signed)
Pt left message that she needs Anderson Malta to call her.

## 2019-10-13 NOTE — Telephone Encounter (Signed)
Pt having more frequent headaches to see PCP, probably not POP

## 2019-10-14 DIAGNOSIS — G43909 Migraine, unspecified, not intractable, without status migrainosus: Secondary | ICD-10-CM | POA: Diagnosis not present

## 2019-10-21 DIAGNOSIS — N760 Acute vaginitis: Secondary | ICD-10-CM | POA: Diagnosis not present

## 2019-11-09 DIAGNOSIS — Z6820 Body mass index (BMI) 20.0-20.9, adult: Secondary | ICD-10-CM | POA: Diagnosis not present

## 2019-11-09 DIAGNOSIS — G43909 Migraine, unspecified, not intractable, without status migrainosus: Secondary | ICD-10-CM | POA: Diagnosis not present

## 2019-11-14 ENCOUNTER — Other Ambulatory Visit: Payer: Self-pay | Admitting: Women's Health

## 2019-12-29 ENCOUNTER — Ambulatory Visit: Payer: BC Managed Care – PPO | Attending: Internal Medicine

## 2019-12-29 ENCOUNTER — Other Ambulatory Visit: Payer: Self-pay | Admitting: Adult Health

## 2019-12-29 ENCOUNTER — Other Ambulatory Visit: Payer: Self-pay

## 2019-12-29 DIAGNOSIS — U071 COVID-19: Secondary | ICD-10-CM | POA: Insufficient documentation

## 2019-12-29 DIAGNOSIS — Z20822 Contact with and (suspected) exposure to covid-19: Secondary | ICD-10-CM

## 2019-12-30 LAB — NOVEL CORONAVIRUS, NAA: SARS-CoV-2, NAA: DETECTED — AB

## 2020-01-24 DIAGNOSIS — M21612 Bunion of left foot: Secondary | ICD-10-CM | POA: Diagnosis not present

## 2020-01-24 DIAGNOSIS — B351 Tinea unguium: Secondary | ICD-10-CM | POA: Diagnosis not present

## 2020-01-24 DIAGNOSIS — M205X1 Other deformities of toe(s) (acquired), right foot: Secondary | ICD-10-CM | POA: Diagnosis not present

## 2020-01-24 DIAGNOSIS — M21611 Bunion of right foot: Secondary | ICD-10-CM | POA: Diagnosis not present

## 2020-01-24 DIAGNOSIS — L603 Nail dystrophy: Secondary | ICD-10-CM | POA: Diagnosis not present

## 2020-02-14 DIAGNOSIS — B351 Tinea unguium: Secondary | ICD-10-CM | POA: Diagnosis not present

## 2020-02-21 DIAGNOSIS — Z6821 Body mass index (BMI) 21.0-21.9, adult: Secondary | ICD-10-CM | POA: Diagnosis not present

## 2020-02-21 DIAGNOSIS — G43909 Migraine, unspecified, not intractable, without status migrainosus: Secondary | ICD-10-CM | POA: Diagnosis not present

## 2020-03-14 ENCOUNTER — Other Ambulatory Visit: Payer: Self-pay | Admitting: Adult Health

## 2020-03-21 DIAGNOSIS — N3090 Cystitis, unspecified without hematuria: Secondary | ICD-10-CM | POA: Diagnosis not present

## 2020-04-22 DIAGNOSIS — N3090 Cystitis, unspecified without hematuria: Secondary | ICD-10-CM | POA: Diagnosis not present

## 2020-06-23 ENCOUNTER — Other Ambulatory Visit: Payer: Self-pay | Admitting: Obstetrics & Gynecology

## 2020-06-23 DIAGNOSIS — Z1231 Encounter for screening mammogram for malignant neoplasm of breast: Secondary | ICD-10-CM

## 2020-06-30 ENCOUNTER — Other Ambulatory Visit: Payer: Self-pay | Admitting: Adult Health

## 2020-07-05 ENCOUNTER — Other Ambulatory Visit: Payer: Self-pay | Admitting: Adult Health

## 2020-07-05 MED ORDER — NORETHINDRONE 0.35 MG PO TABS: ORAL_TABLET | ORAL | 4 refills | Status: DC

## 2020-07-05 NOTE — Telephone Encounter (Signed)
Patient called in because she said pharmacy has been trying to reach [place] about getting her birth control refilled, and she wants to know if she can receive a call back about if she can get a refill.

## 2020-07-05 NOTE — Progress Notes (Signed)
Refilled OCs to [facility]

## 2020-07-05 NOTE — Telephone Encounter (Signed)
Refilled OCs to [facility]

## 2020-07-06 ENCOUNTER — Other Ambulatory Visit: Payer: Self-pay

## 2020-07-06 ENCOUNTER — Ambulatory Visit
Admission: RE | Admit: 2020-07-06 | Discharge: 2020-07-06 | Disposition: A | Discharge status: Home / Self Care | Payer: BC Managed Care – PPO | Source: Ambulatory Visit | Attending: Obstetrics & Gynecology | Admitting: Obstetrics & Gynecology

## 2020-07-06 DIAGNOSIS — Z1231 Encounter for screening mammogram for malignant neoplasm of breast: Secondary | ICD-10-CM

## 2020-08-30 DIAGNOSIS — N390 Urinary tract infection, site not specified: Secondary | ICD-10-CM | POA: Diagnosis not present

## 2020-09-08 DIAGNOSIS — G43909 Migraine, unspecified, not intractable, without status migrainosus: Secondary | ICD-10-CM | POA: Diagnosis not present

## 2020-09-08 DIAGNOSIS — Z1331 Encounter for screening for depression: Secondary | ICD-10-CM | POA: Diagnosis not present

## 2020-09-08 DIAGNOSIS — R300 Dysuria: Secondary | ICD-10-CM | POA: Diagnosis not present

## 2020-09-08 DIAGNOSIS — Z6820 Body mass index (BMI) 20.0-20.9, adult: Secondary | ICD-10-CM | POA: Diagnosis not present

## 2020-09-26 ENCOUNTER — Encounter: Payer: Self-pay | Admitting: Adult Health

## 2020-09-26 ENCOUNTER — Ambulatory Visit (INDEPENDENT_AMBULATORY_CARE_PROVIDER_SITE_OTHER): Payer: BC Managed Care – PPO | Admitting: Adult Health

## 2020-09-26 ENCOUNTER — Other Ambulatory Visit (HOSPITAL_COMMUNITY)
Admission: RE | Admit: 2020-09-26 | Discharge: 2020-09-26 | Disposition: A | Discharge status: Home / Self Care | Payer: BC Managed Care – PPO | Source: Ambulatory Visit | Attending: Adult Health | Admitting: Adult Health

## 2020-09-26 VITALS — BP 116/78 | HR 82 | Ht 65.0 in | Wt 115.0 lb

## 2020-09-26 DIAGNOSIS — Z1211 Encounter for screening for malignant neoplasm of colon: Secondary | ICD-10-CM

## 2020-09-26 DIAGNOSIS — Z01419 Encounter for gynecological examination (general) (routine) without abnormal findings: Secondary | ICD-10-CM | POA: Insufficient documentation

## 2020-09-26 DIAGNOSIS — Z3041 Encounter for surveillance of contraceptive pills: Secondary | ICD-10-CM | POA: Diagnosis not present

## 2020-09-26 DIAGNOSIS — N390 Urinary tract infection, site not specified: Secondary | ICD-10-CM

## 2020-09-26 DIAGNOSIS — R319 Hematuria, unspecified: Secondary | ICD-10-CM

## 2020-09-26 LAB — HEMOCCULT GUIAC POC 1CARD (OFFICE): Fecal Occult Blood, POC: NEGATIVE

## 2020-09-26 LAB — POCT URINALYSIS DIPSTICK OB
Glucose, UA: NEGATIVE
Leukocytes, UA: NEGATIVE
Nitrite, UA: NEGATIVE
POC,PROTEIN,UA: NEGATIVE

## 2020-09-26 MED ORDER — NORETHINDRONE 0.35 MG PO TABS: ORAL_TABLET | ORAL | 4 refills | Status: DC

## 2020-09-26 NOTE — Progress Notes (Signed)
Patient ID: Kathleen Hughes, female   DOB: 02-16-1976, 44 y.o.   MRN: [id number] History of Present Illness: Kathleen Hughes is a 44 year old whit female, married, G1P1 in for a well woman gyn exam and pap. PCP is [facility].    Current Medications, Allergies, Past Medical History, Past Surgical History, Family History and Social History were reviewed in [employer] record.     Review of Systems:  Patient denies any headaches, hearing loss, fatigue, blurred vision, shortness of breath, chest pain, abdominal pain, problems with bowel movements, urination, or intercourse(has noticed some burning/dryness). No joint pain or mood swings. Had recent UTI.  Physical Exam:BP 116/78 (BP Location: Left Arm, Patient Position: Sitting, Cuff Size: Normal)    Pulse 82    Ht 5\' 5"  (1.651 m)    Wt 115 lb (52.2 kg)    BMI 19.14 kg/m  urine +blood and ketones. General:  Well developed, well nourished, no acute distress Skin:  Warm and dry Neck:  Midline trachea, normal thyroid, good ROM, no lymphadenopathy Lungs; Clear to auscultation bilaterally Breast:  No dominant palpable mass, retraction, or nipple discharge Cardiovascular: Regular rate and rhythm Abdomen:  Soft, non tender, no hepatosplenomegaly Pelvic:  External genitalia is normal in appearance, no lesions.  The vagina is normal in appearance. Urethra has no lesions or masses. The cervix is smooth, pap with high risk HPV genotyping performed.  Uterus is felt to be normal size, shape, and contour.  No adnexal masses or tenderness noted.Bladder is non tender, no masses felt. Rectal: Good sphincter tone, no polyps, or hemorrhoids felt.  Hemoccult negative. Extremities/musculoskeletal:  No swelling or varicosities noted, no clubbing or cyanosis Psych:  No mood changes, alert and cooperative,seems happy AA is 0 Fall risk is low PHQ 9 score is 2.  [facility] - 09/26/20 1014      Pregnancy Intention Screening   Does the patient want to  become pregnant in the next year? No    Does the patient's partner want to become pregnant in the next year? No    Would the patient like to discuss contraceptive options today? No      Contraception Wrap Up   Current Method Oral Contraceptive    End Method Oral Contraceptive    Contraception Counseling Provided No          Examination chaperoned by Levy Pupa LPN.  Impression and Plan:  1. Encounter for gynecological examination with Papanicolaou smear of cervix Pap sent Physical in 1 year Pap in 3 if normal Mammogram yearly Labs with PCP Try good lubricate with intercourse, like astro glide, EVOO or coconut oil   2. Urinary tract infection with hematuria, site unspecified UA C&S sent   3. Encounter for surveillance of contraceptive pills Will refill OCs Meds ordered this encounter  Medications   norethindrone (NORLYDA) 0.35 MG tablet    Sig: TAKE 1 TABLET BY MOUTH DAILY. GENERIC EQUIVALENT TO NORA-BE    Dispense:  84 tablet    Refill:  4    Order Specific Question:   Supervising Provider    Answer:   Elonda Husky, LUTHER H [[id number]]    4. Encounter for screening fecal occult blood testing

## 2020-09-27 LAB — URINALYSIS, ROUTINE W REFLEX MICROSCOPIC
Bilirubin, UA: NEGATIVE
Glucose, UA: NEGATIVE
Ketones, UA: NEGATIVE
Leukocytes,UA: NEGATIVE
Nitrite, UA: NEGATIVE
Protein,UA: NEGATIVE
Specific Gravity, UA: 1.008 (ref 1.005–1.030)
Urobilinogen, Ur: 0.2 mg/dL (ref 0.2–1.0)
pH, UA: 6.5 (ref 5.0–7.5)

## 2020-09-27 LAB — CYTOLOGY - PAP
Adequacy: ABSENT
Comment: NEGATIVE
Diagnosis: NEGATIVE
High risk HPV: NEGATIVE

## 2020-09-27 LAB — MICROSCOPIC EXAMINATION
Bacteria, UA: NONE SEEN
Casts: NONE SEEN /lpf
RBC: NONE SEEN /hpf (ref 0–2)
WBC, UA: NONE SEEN /hpf (ref 0–5)

## 2020-09-28 LAB — URINE CULTURE

## 2020-10-09 DIAGNOSIS — R309 Painful micturition, unspecified: Secondary | ICD-10-CM | POA: Diagnosis not present

## 2020-10-09 DIAGNOSIS — R300 Dysuria: Secondary | ICD-10-CM | POA: Diagnosis not present

## 2020-10-09 DIAGNOSIS — N760 Acute vaginitis: Secondary | ICD-10-CM | POA: Diagnosis not present

## 2020-10-09 DIAGNOSIS — R319 Hematuria, unspecified: Secondary | ICD-10-CM | POA: Diagnosis not present

## 2020-10-09 DIAGNOSIS — R3129 Other microscopic hematuria: Secondary | ICD-10-CM | POA: Diagnosis not present

## 2020-10-15 DIAGNOSIS — N390 Urinary tract infection, site not specified: Secondary | ICD-10-CM | POA: Diagnosis not present

## 2020-11-20 DIAGNOSIS — N390 Urinary tract infection, site not specified: Secondary | ICD-10-CM | POA: Diagnosis not present

## 2020-11-24 DIAGNOSIS — R3121 Asymptomatic microscopic hematuria: Secondary | ICD-10-CM | POA: Diagnosis not present

## 2020-12-05 DIAGNOSIS — R319 Hematuria, unspecified: Secondary | ICD-10-CM | POA: Diagnosis not present

## 2021-01-04 DIAGNOSIS — R3121 Asymptomatic microscopic hematuria: Secondary | ICD-10-CM | POA: Diagnosis not present

## 2021-01-04 DIAGNOSIS — R918 Other nonspecific abnormal finding of lung field: Secondary | ICD-10-CM | POA: Diagnosis not present

## 2021-03-08 DIAGNOSIS — R918 Other nonspecific abnormal finding of lung field: Secondary | ICD-10-CM | POA: Diagnosis not present

## 2021-03-08 DIAGNOSIS — R911 Solitary pulmonary nodule: Secondary | ICD-10-CM | POA: Diagnosis not present

## 2021-03-08 DIAGNOSIS — J984 Other disorders of lung: Secondary | ICD-10-CM | POA: Diagnosis not present

## 2021-03-13 ENCOUNTER — Ambulatory Visit: Payer: BC Managed Care – PPO | Admitting: Physician Assistant

## 2021-03-13 ENCOUNTER — Encounter: Payer: Self-pay | Admitting: Urology

## 2021-03-13 ENCOUNTER — Other Ambulatory Visit: Payer: Self-pay

## 2021-03-13 ENCOUNTER — Encounter: Payer: Self-pay | Admitting: Physician Assistant

## 2021-03-13 DIAGNOSIS — C44519 Basal cell carcinoma of skin of other part of trunk: Secondary | ICD-10-CM | POA: Diagnosis not present

## 2021-03-13 DIAGNOSIS — L609 Nail disorder, unspecified: Secondary | ICD-10-CM | POA: Diagnosis not present

## 2021-03-13 DIAGNOSIS — C4491 Basal cell carcinoma of skin, unspecified: Secondary | ICD-10-CM

## 2021-03-13 DIAGNOSIS — I7300 Raynaud's syndrome without gangrene: Secondary | ICD-10-CM

## 2021-03-13 DIAGNOSIS — Z85828 Personal history of other malignant neoplasm of skin: Secondary | ICD-10-CM

## 2021-03-13 DIAGNOSIS — Z87898 Personal history of other specified conditions: Secondary | ICD-10-CM

## 2021-03-13 DIAGNOSIS — Z1283 Encounter for screening for malignant neoplasm of skin: Secondary | ICD-10-CM

## 2021-03-13 DIAGNOSIS — Z86018 Personal history of other benign neoplasm: Secondary | ICD-10-CM | POA: Diagnosis not present

## 2021-03-13 DIAGNOSIS — D485 Neoplasm of uncertain behavior of skin: Secondary | ICD-10-CM

## 2021-03-13 HISTORY — DX: Basal cell carcinoma of skin, unspecified: C44.91

## 2021-03-13 NOTE — Patient Instructions (Addendum)
Overview. Raynaud's (ray-NOSE) disease causes some areas of your body -- such as your fingers and toes -- to feel numb and cold in response to cold temperatures or stress.   Get otc Dermanail for toenail    Biopsy, Surgery (Curettage) & Surgery (Excision) Aftercare Instructions  1. Okay to remove bandage in 24 hours  2. Wash area with soap and water  3. Apply Vaseline to area twice daily until healed (Not Neosporin)  4. Okay to cover with a Band-Aid to decrease the chance of infection or prevent irritation from clothing; also it's okay to uncover lesion at home.  5. Suture instructions: return to our office in 7-10 or 10-14 days for a nurse visit for suture removal. Variable healing with sutures, if pain or itching occurs call our office. It's okay to shower or bathe 24 hours after sutures are given.  6. The following risks may occur after a biopsy, curettage or excision: bleeding, scarring, discoloration, recurrence, infection (redness, yellow drainage, pain or swelling).  7. For questions, concerns and results call our office at [place] before 4pm & Friday before 3pm. Biopsy results will be available in 1 week.

## 2021-03-13 NOTE — Progress Notes (Signed)
   Follow-Up Visit   Subjective  Kathleen Hughes is a 45 y.o. female who presents for the following: Annual Exam (Left chest x months no bleeding just red).   The following portions of the chart were reviewed this encounter and updated as appropriate:  Tobacco  Allergies  Meds  Problems  Med Hx  Surg Hx  Fam Hx      Objective  Well appearing patient in no apparent distress; mood and affect are within normal limits.  A full examination was performed including scalp, head, eyes, ears, nose, lips, neck, chest, axillae, abdomen, back, buttocks, bilateral upper extremities, bilateral lower extremities, hands, feet, fingers, toes, fingernails, and toenails. All findings within normal limits unless otherwise noted below.  Objective  Left Inguinal Area: No signs of non-mole skin cancer.   Objective  Right Breast: Mild- scar clearNo signs of non-mole skin cancer.   Objective  Chest - Medial [facility]): Bcc on chest bowens on nose tx aldara- scars clear  Objective  Right Hallux Toe Nail Plate: Yellowish/white discoloration. Cuticle doesn't grow out.  Objective  Left Dorsum of Foot: Overview. Raynaud's (ray-NOSE) disease causes some areas of your body -- such as your fingers and toes -- to feel numb and cold in response to cold temperatures or stress.  Fingers and toes diffuse erythema. Positive blanch with delayed capillary refill. No edema  Objective  Left Breast: Pearly papule with telangectasia.       Assessment & Plan  Screening exam for skin cancer Left Inguinal Area  Yearly skin check  History of atypical nevus Right Breast  observe  History of basal cell carcinoma (BCC) Chest - Medial (Center)  observe  Nail disorder Right Hallux Toe Nail Plate  Over the counter Dermanail  Raynaud's disease without gangrene Left Dorsum of Foot  Follow up with pcp  Neoplasm of uncertain behavior of skin Left Breast  Skin / nail biopsy Type of biopsy:  tangential   Informed consent: discussed and consent obtained   Timeout: patient name, date of birth, surgical site, and procedure verified   Anesthesia: the lesion was anesthetized in a standard fashion   Anesthetic:  1% lidocaine w/ epinephrine 1-100,000 local infiltration Instrument used: flexible razor blade   Hemostasis achieved with: aluminum chloride and electrodesiccation   Outcome: patient tolerated procedure well   Post-procedure details: wound care instructions given    Specimen 1 - Surgical pathology Differential Diagnosis: bcc scc (stat pick up)  Check Margins: No    I, Keyshon Stein, PA-C, have reviewed all documentation's for this visit.  The documentation on 03/13/21 for the exam, diagnosis, procedures and orders are all accurate and complete.

## 2021-03-14 ENCOUNTER — Encounter: Payer: Self-pay | Admitting: *Deleted

## 2021-03-14 ENCOUNTER — Telehealth: Payer: Self-pay | Admitting: *Deleted

## 2021-03-14 NOTE — Telephone Encounter (Signed)
-----   Message from Warren Danes, [place] sent at 03/14/2021  1:34 PM EDT -----  Refer to mohs

## 2021-03-14 NOTE — Telephone Encounter (Signed)
Pathology to patient- referral sent to skin surgery center.

## 2021-03-15 ENCOUNTER — Other Ambulatory Visit: Payer: Self-pay | Admitting: *Deleted

## 2021-03-16 ENCOUNTER — Other Ambulatory Visit (HOSPITAL_COMMUNITY): Payer: Self-pay | Admitting: Radiology

## 2021-03-16 DIAGNOSIS — R918 Other nonspecific abnormal finding of lung field: Secondary | ICD-10-CM

## 2021-03-19 ENCOUNTER — Other Ambulatory Visit (HOSPITAL_COMMUNITY)
Admission: RE | Admit: 2021-03-19 | Discharge: 2021-03-19 | Disposition: A | Discharge status: Home / Self Care | Payer: BC Managed Care – PPO | Source: Ambulatory Visit | Attending: Urology | Admitting: Urology

## 2021-03-19 DIAGNOSIS — Z20822 Contact with and (suspected) exposure to covid-19: Secondary | ICD-10-CM | POA: Insufficient documentation

## 2021-03-19 DIAGNOSIS — Z01812 Encounter for preprocedural laboratory examination: Secondary | ICD-10-CM | POA: Insufficient documentation

## 2021-03-19 LAB — SARS CORONAVIRUS 2 (TAT 6-24 HRS): SARS Coronavirus 2: NEGATIVE

## 2021-03-20 ENCOUNTER — Other Ambulatory Visit: Payer: Self-pay

## 2021-03-20 ENCOUNTER — Ambulatory Visit (HOSPITAL_COMMUNITY)
Admission: RE | Admit: 2021-03-20 | Discharge: 2021-03-20 | Disposition: A | Discharge status: Home / Self Care | Payer: BC Managed Care – PPO | Source: Ambulatory Visit | Attending: Urology | Admitting: Urology

## 2021-03-20 DIAGNOSIS — R918 Other nonspecific abnormal finding of lung field: Secondary | ICD-10-CM | POA: Diagnosis not present

## 2021-03-20 LAB — PULMONARY FUNCTION TEST
DL/VA % pred: 101 %
DL/VA: 4.39 ml/min/mmHg/L
DLCO unc % pred: 89 %
DLCO unc: 19.76 ml/min/mmHg
FEF 25-75 Post: 3.46 L/sec
FEF 25-75 Pre: 3.58 L/sec
FEF2575-%Change-Post: -3 %
FEF2575-%Pred-Post: 116 %
FEF2575-%Pred-Pre: 120 %
FEV1-%Change-Post: 0 %
FEV1-%Pred-Post: 99 %
FEV1-%Pred-Pre: 99 %
FEV1-Post: 2.99 L
FEV1-Pre: 2.98 L
FEV1FVC-%Change-Post: 2 %
FEV1FVC-%Pred-Pre: 104 %
FEV6-%Change-Post: -2 %
FEV6-%Pred-Post: 94 %
FEV6-%Pred-Pre: 96 %
FEV6-Post: 3.45 L
FEV6-Pre: 3.52 L
FEV6FVC-%Pred-Post: 102 %
FEV6FVC-%Pred-Pre: 102 %
FVC-%Change-Post: -2 %
FVC-%Pred-Post: 92 %
FVC-%Pred-Pre: 94 %
FVC-Post: 3.45 L
FVC-Pre: 3.52 L
Post FEV1/FVC ratio: 87 %
Post FEV6/FVC ratio: 100 %
Pre FEV1/FVC ratio: 84 %
Pre FEV6/FVC Ratio: 100 %
RV % pred: 97 %
RV: 1.72 L
TLC % pred: 100 %
TLC: 5.22 L

## 2021-03-20 MED ORDER — ALBUTEROL SULFATE (2.5 MG/3ML) 0.083% IN NEBU
2.5000 mg | INHALATION_SOLUTION | Freq: Once | RESPIRATORY_TRACT | Status: AC
  Administered 2021-03-20: 2.5 mg via RESPIRATORY_TRACT

## 2021-03-21 ENCOUNTER — Other Ambulatory Visit (HOSPITAL_COMMUNITY): Payer: Self-pay | Admitting: Urology

## 2021-03-21 DIAGNOSIS — R918 Other nonspecific abnormal finding of lung field: Secondary | ICD-10-CM

## 2021-03-23 ENCOUNTER — Ambulatory Visit (HOSPITAL_COMMUNITY)
Admission: RE | Admit: 2021-03-23 | Discharge: 2021-03-23 | Disposition: A | Discharge status: Home / Self Care | Payer: BC Managed Care – PPO | Source: Ambulatory Visit | Attending: Urology | Admitting: Urology

## 2021-03-23 ENCOUNTER — Other Ambulatory Visit: Payer: Self-pay

## 2021-03-23 DIAGNOSIS — R911 Solitary pulmonary nodule: Secondary | ICD-10-CM | POA: Diagnosis not present

## 2021-03-23 DIAGNOSIS — R918 Other nonspecific abnormal finding of lung field: Secondary | ICD-10-CM | POA: Insufficient documentation

## 2021-03-23 LAB — GLUCOSE, CAPILLARY: Glucose-Capillary: 93 mg/dL (ref 70–99)

## 2021-03-23 MED ORDER — FLUDEOXYGLUCOSE F - 18 (FDG) INJECTION
5.7000 | Freq: Once | INTRAVENOUS | Status: AC | PRN
  Administered 2021-03-23: 5.7 via INTRAVENOUS

## 2021-03-26 ENCOUNTER — Encounter: Payer: Self-pay | Admitting: Thoracic Surgery (Cardiothoracic Vascular Surgery)

## 2021-03-26 ENCOUNTER — Other Ambulatory Visit: Payer: Self-pay

## 2021-03-26 ENCOUNTER — Institutional Professional Consult (permissible substitution): Payer: BC Managed Care – PPO | Admitting: Thoracic Surgery (Cardiothoracic Vascular Surgery)

## 2021-03-26 VITALS — BP 129/79 | HR 89 | Resp 20 | Wt 119.0 lb

## 2021-03-26 DIAGNOSIS — R911 Solitary pulmonary nodule: Secondary | ICD-10-CM | POA: Diagnosis not present

## 2021-03-26 NOTE — H&P (View-Only) (Signed)
PCP is [facility] Referring Provider is Davis Gourd*  Chief Complaint  Patient presents with  . Lung Mass    Initial surgical consult, CT chest 3/24, PFT 4/8, PET 4/8    HPI: Kathleen Hughes is sent for consultation regarding a right lower lobe lung nodule.  Kathleen Hughes is a 45 year old woman with a history of recurrent UTIs and basal cell skin cancer.  She was being evaluated for microhematuria.  A CT of the abdomen and pelvis showed a right lower lobe lung nodule back in December.  She had a follow-up scan in March which showed a slight increase in size of the nodule measuring about 11 x 7 mm.  She is a lifelong non-smoker.  She did have Covid but has not had any other respiratory issues.  She denies any chest pain, tightness, pressure, shortness of breath, cough, or wheezing.  Her weight fluctuates a little bit but she has not had any significant weight loss in the past 3 months.  She does have headaches but that is a chronic problem.  Zubrod Score: At the time of surgery this patient's most appropriate activity status/level should be described as: [x]     0    Normal activity, no symptoms []     1    Restricted in physical strenuous activity but ambulatory, able to do out light work []     2    Ambulatory and capable of self care, unable to do work activities, up and about >50 % of waking hours                              []     3    Only limited self care, in bed greater than 50% of waking hours []     4    Completely disabled, no self care, confined to bed or chair []     5    Moribund  Past Medical History:  Diagnosis Date  . Atypical mole 01/19/2014   mild right breast  . Basal cell carcinoma 01/17/2009   nose bowens  tx-aldara  . Basal cell carcinoma of skin 03/13/2021   nod-left breast ([facility])  . BCC (basal cell carcinoma of skin) 07/08/2013   chest [id number] [id number]  . Breast mass, right 08/24/2013   1 at 12-1 o'clock and 1 at 3 o'clock will get  mammogram and [place] at breast center  . Contraceptive management 02/15/2015  . Migraines   . Recurrent cold sores   . UTI (lower urinary tract infection) 02/15/2015    Past Surgical History:  Procedure Laterality Date  . BREAST BIOPSY Right   . CESAREAN SECTION      History reviewed. No pertinent family history.  Social History Social History   Tobacco Use  . Smoking status: Never Smoker  . Smokeless tobacco: Never Used  Vaping Use  . Vaping Use: Never used  Substance Use Topics  . Alcohol use: No  . Drug use: No    Current Outpatient Medications  Medication Sig Dispense Refill  . norethindrone (NORLYDA) 0.35 MG tablet TAKE 1 TABLET BY MOUTH DAILY. GENERIC EQUIVALENT TO NORA-BE 84 tablet 4  . topiramate (TOPAMAX) 25 MG tablet Take 25 mg by mouth daily.     No current facility-administered medications for this visit.    No Known Allergies  Review of Systems  Constitutional: Negative for unexpected weight change.  HENT: Negative for trouble swallowing and voice change.  Respiratory: Negative for cough, shortness of breath and wheezing.   Cardiovascular: Negative for chest pain and leg swelling.       "Poor circulation" in hands and feet  Gastrointestinal: Negative for abdominal distention and abdominal pain.  Genitourinary: Positive for dysuria.       UTIs  Musculoskeletal: Negative for arthralgias and myalgias.  Neurological: Positive for headaches. Negative for seizures and weakness.  Hematological: Negative for adenopathy. Does not bruise/bleed easily.  All other systems reviewed and are negative.   BP 129/79 (BP Location: Right Arm, Patient Position: Sitting)   Pulse 89   Resp 20   Wt 119 lb (54 kg)   SpO2 99%   BMI 19.80 kg/m  Physical Exam Constitutional:      General: She is not in acute distress.    Appearance: Normal appearance.  HENT:     Head: Normocephalic and atraumatic.  Eyes:     General: No scleral icterus.    Extraocular Movements:  Extraocular movements intact.  Cardiovascular:     Rate and Rhythm: Normal rate and regular rhythm.     Heart sounds: Normal heart sounds. No murmur heard.   Pulmonary:     Effort: Pulmonary effort is normal. No respiratory distress.     Breath sounds: Normal breath sounds. No wheezing or rales.  Abdominal:     General: There is no distension.     Palpations: Abdomen is soft.  Musculoskeletal:        General: No swelling.     Cervical back: Neck supple.  Lymphadenopathy:     Cervical: No cervical adenopathy.  Skin:    General: Skin is warm and dry.  Neurological:     General: No focal deficit present.     Mental Status: She is alert and oriented to person, place, and time.     Cranial Nerves: No cranial nerve deficit.     Motor: No weakness.     Gait: Gait normal.    Diagnostic Tests: NUCLEAR MEDICINE PET SKULL BASE TO THIGH  TECHNIQUE: 5.8 mCi F-18 FDG was injected intravenously. Full-ring PET imaging was performed from the skull base to thigh after the radiotracer. CT data was obtained and used for attenuation correction and anatomic localization.  Fasting blood glucose: 93 mg/dl  COMPARISON:  03/08/2021 chest CT.  FINDINGS: Mediastinal blood pool activity: SUV max 2.3  Liver activity: SUV max NA  NECK: No hypermetabolic lymph nodes in the neck.  Incidental CT findings: none  CHEST:  Solid 0.9 cm medial right lower lobe pulmonary nodule demonstrates low level hypermetabolism with max SUV 2.5 (series 8/image 49), unchanged in size since 03/08/2021 chest CT. No additional hypermetabolic pulmonary findings. No enlarged or hypermetabolic axillary, mediastinal or hilar lymph nodes.  Incidental CT findings: none  ABDOMEN/PELVIS: No abnormal hypermetabolic activity within the liver, pancreas, adrenal glands, or spleen. No hypermetabolic lymph nodes in the abdomen or pelvis.  Incidental CT findings: none  SKELETON: No focal hypermetabolic  activity to suggest skeletal metastasis.  Incidental CT findings: none  IMPRESSION: 1. Low level hypermetabolism (max SUV 2.5) associated with solid 0.9 cm medial right lower lobe pulmonary nodule, which is unchanged in size since 03/08/2021 chest CT. A low-grade primary bronchogenic malignancy cannot be excluded. Consider tissue sampling versus continued close chest CT surveillance, as clinically warranted. 2. No hypermetabolic thoracic adenopathy or distant metastatic disease.   Electronically Signed   By: Ilona Sorrel M.D.   On: 03/25/2021 14:21 I personally reviewed the CT images and  concur with the findings of a 9 mm solid nodule in the medial right lower lobe with low-grade metabolic activity.  Pulmonary function testing FVC 3.52 (94%) FEV1 2.98 (99%) DLCO 19.76 (89%)  Impression: Kathleen Hughes is a 45 year old woman with a history of basal cell carcinoma the skin and recurrent UTIs.  She is a lifelong non-smoker.  She recently was being evaluated for microhematuria and was found to have a right lower lobe lung nodule.  A follow-up CT in 3 months showed a slight increase in size.  There was no hilar or mediastinal adenopathy.  A PET/CT showed low-grade uptake with an SUV of 2.5.  I reviewed the images with Kathleen Hughes and her husband.  We discussed the differential diagnosis which includes primary bronchogenic carcinoma, carcinoid tumor, as well as infectious and inflammatory nodules.  Given that she is a non-smoker, a carcinoid tumor is more likely.  However I cannot rule out the possibility of a primary bronchogenic carcinoma.  We discussed the options of continued radiographic follow-up versus surgical resection.  This is not in a location is amenable to either CT-guided or bronchoscopic biopsy.  Given the possible increase in size in 3 months and the activity on PET CT I would not advise radiographic follow-up.  I recommended that we proceed with a robotic assisted  right VATS for wedge resection of the lower lobe nodule and possible right lower lobectomy if this did turn out to be a primary bronchogenic carcinoma.  We also might be able to do a segmentectomy depending on the intraoperative findings.  I discussed the proposed operation with her and her husband.  I reviewed the general nature of the procedure including the incisions to be used, the use of the robot, the use of a drainage tube postoperatively, the expected hospital stay, and the overall recovery.  I informed him of the indications, risks, benefits, and alternatives.  They understand the risks include, but are not limited to death, MI, DVT, PE, bleeding, possible need for transfusion, infection, air leaks, as well as possibility of other unforeseeable complications.  She is a low risk patient.  She wishes to go ahead and proceed with surgery.  Plan: Robotic assisted right VATS for wedge resection and possible lobectomy on Wednesday, Apr 18, 2021  Melrose Nakayama, MD [facility] [phone]

## 2021-03-26 NOTE — Progress Notes (Signed)
PCP is [facility] Referring Provider is Davis Gourd*  Chief Complaint  Patient presents with  . Lung Mass    Initial surgical consult, CT chest 3/24, PFT 4/8, PET 4/8    HPI: Kathleen Hughes is sent for consultation regarding a right lower lobe lung nodule.  Kathleen Hughes is a 45 year old woman with a history of recurrent UTIs and basal cell skin cancer.  She was being evaluated for microhematuria.  A CT of the abdomen and pelvis showed a right lower lobe lung nodule back in December.  She had a follow-up scan in March which showed a slight increase in size of the nodule measuring about 11 x 7 mm.  She is a lifelong non-smoker.  She did have Covid but has not had any other respiratory issues.  She denies any chest pain, tightness, pressure, shortness of breath, cough, or wheezing.  Her weight fluctuates a little bit but she has not had any significant weight loss in the past 3 months.  She does have headaches but that is a chronic problem.  Zubrod Score: At the time of surgery this patient's most appropriate activity status/level should be described as: [x]     0    Normal activity, no symptoms []     1    Restricted in physical strenuous activity but ambulatory, able to do out light work []     2    Ambulatory and capable of self care, unable to do work activities, up and about >50 % of waking hours                              []     3    Only limited self care, in bed greater than 50% of waking hours []     4    Completely disabled, no self care, confined to bed or chair []     5    Moribund  Past Medical History:  Diagnosis Date  . Atypical mole 01/19/2014   mild right breast  . Basal cell carcinoma 01/17/2009   nose bowens  tx-aldara  . Basal cell carcinoma of skin 03/13/2021   nod-left breast ([facility])  . BCC (basal cell carcinoma of skin) 07/08/2013   chest tx-cx3 61fu  . Breast mass, right 08/24/2013   1 at 12-1 o'clock and 1 at 3 o'clock will get  mammogram and [place] at [facility] center  . Contraceptive management 02/15/2015  . Migraines   . Recurrent cold sores   . UTI (lower urinary tract infection) 02/15/2015    Past Surgical History:  Procedure Laterality Date  . BREAST BIOPSY Right   . CESAREAN SECTION      History reviewed. No pertinent family history.  Social History Social History   Tobacco Use  . Smoking status: Never Smoker  . Smokeless tobacco: Never Used  Vaping Use  . Vaping Use: Never used  Substance Use Topics  . Alcohol use: No  . Drug use: No    Current Outpatient Medications  Medication Sig Dispense Refill  . norethindrone (NORLYDA) 0.35 MG tablet TAKE 1 TABLET BY MOUTH DAILY. GENERIC EQUIVALENT TO NORA-BE 84 tablet 4  . topiramate (TOPAMAX) 25 MG tablet Take 25 mg by mouth daily.     No current facility-administered medications for this visit.    No Known Allergies  Review of Systems  Constitutional: Negative for unexpected weight change.  HENT: Negative for trouble swallowing and voice change.  Respiratory: Negative for cough, shortness of breath and wheezing.   Cardiovascular: Negative for chest pain and leg swelling.       "Poor circulation" in hands and feet  Gastrointestinal: Negative for abdominal distention and abdominal pain.  Genitourinary: Positive for dysuria.       UTIs  Musculoskeletal: Negative for arthralgias and myalgias.  Neurological: Positive for headaches. Negative for seizures and weakness.  Hematological: Negative for adenopathy. Does not bruise/bleed easily.  All other systems reviewed and are negative.   BP 129/79 (BP Location: Right Arm, Patient Position: Sitting)   Pulse 89   Resp 20   Wt 119 lb (54 kg)   SpO2 99%   BMI 19.80 kg/m  Physical Exam Constitutional:      General: She is not in acute distress.    Appearance: Normal appearance.  HENT:     Head: Normocephalic and atraumatic.  Eyes:     General: No scleral icterus.    Extraocular Movements:  Extraocular movements intact.  Cardiovascular:     Rate and Rhythm: Normal rate and regular rhythm.     Heart sounds: Normal heart sounds. No murmur heard.   Pulmonary:     Effort: Pulmonary effort is normal. No respiratory distress.     Breath sounds: Normal breath sounds. No wheezing or rales.  Abdominal:     General: There is no distension.     Palpations: Abdomen is soft.  Musculoskeletal:        General: No swelling.     Cervical back: Neck supple.  Lymphadenopathy:     Cervical: No cervical adenopathy.  Skin:    General: Skin is warm and dry.  Neurological:     General: No focal deficit present.     Mental Status: She is alert and oriented to person, place, and time.     Cranial Nerves: No cranial nerve deficit.     Motor: No weakness.     Gait: Gait normal.    Diagnostic Tests: NUCLEAR MEDICINE PET SKULL BASE TO THIGH  TECHNIQUE: 5.8 mCi F-18 FDG was injected intravenously. Full-ring PET imaging was performed from the skull base to thigh after the radiotracer. CT data was obtained and used for attenuation correction and anatomic localization.  Fasting blood glucose: 93 mg/dl  COMPARISON:  03/08/2021 chest CT.  FINDINGS: Mediastinal blood pool activity: SUV max 2.3  Liver activity: SUV max NA  NECK: No hypermetabolic lymph nodes in the neck.  Incidental CT findings: none  CHEST:  Solid 0.9 cm medial right lower lobe pulmonary nodule demonstrates low level hypermetabolism with max SUV 2.5 (series 8/image 49), unchanged in size since 03/08/2021 chest CT. No additional hypermetabolic pulmonary findings. No enlarged or hypermetabolic axillary, mediastinal or hilar lymph nodes.  Incidental CT findings: none  ABDOMEN/PELVIS: No abnormal hypermetabolic activity within the liver, pancreas, adrenal glands, or spleen. No hypermetabolic lymph nodes in the abdomen or pelvis.  Incidental CT findings: none  SKELETON: No focal hypermetabolic  activity to suggest skeletal metastasis.  Incidental CT findings: none  IMPRESSION: 1. Low level hypermetabolism (max SUV 2.5) associated with solid 0.9 cm medial right lower lobe pulmonary nodule, which is unchanged in size since 03/08/2021 chest CT. A low-grade primary bronchogenic malignancy cannot be excluded. Consider tissue sampling versus continued close chest CT surveillance, as clinically warranted. 2. No hypermetabolic thoracic adenopathy or distant metastatic disease.   Electronically Signed   By: Ilona Sorrel M.D.   On: 03/25/2021 14:21 I personally reviewed the CT images and  concur with the findings of a 9 mm solid nodule in the medial right lower lobe with low-grade metabolic activity.  Pulmonary function testing FVC 3.52 (94%) FEV1 2.98 (99%) DLCO 19.76 (89%)  Impression: Kathleen Hughes is a 45 year old woman with a history of basal cell carcinoma the skin and recurrent UTIs.  She is a lifelong non-smoker.  She recently was being evaluated for microhematuria and was found to have a right lower lobe lung nodule.  A follow-up CT in 3 months showed a slight increase in size.  There was no hilar or mediastinal adenopathy.  A PET/CT showed low-grade uptake with an SUV of 2.5.  I reviewed the images with Kathleen Hughes and her husband.  We discussed the differential diagnosis which includes primary bronchogenic carcinoma, carcinoid tumor, as well as infectious and inflammatory nodules.  Given that she is a non-smoker, a carcinoid tumor is more likely.  However I cannot rule out the possibility of a primary bronchogenic carcinoma.  We discussed the options of continued radiographic follow-up versus surgical resection.  This is not in a location is amenable to either CT-guided or bronchoscopic biopsy.  Given the possible increase in size in 3 months and the activity on PET CT I would not advise radiographic follow-up.  I recommended that we proceed with a robotic assisted  right VATS for wedge resection of the lower lobe nodule and possible right lower lobectomy if this did turn out to be a primary bronchogenic carcinoma.  We also might be able to do a segmentectomy depending on the intraoperative findings.  I discussed the proposed operation with her and her husband.  I reviewed the general nature of the procedure including the incisions to be used, the use of the robot, the use of a drainage tube postoperatively, the expected hospital stay, and the overall recovery.  I informed him of the indications, risks, benefits, and alternatives.  They understand the risks include, but are not limited to death, MI, DVT, PE, bleeding, possible need for transfusion, infection, air leaks, as well as possibility of other unforeseeable complications.  She is a low risk patient.  She wishes to go ahead and proceed with surgery.  Plan: Robotic assisted right VATS for wedge resection and possible lobectomy on Wednesday, Apr 18, 2021  Melrose Nakayama, MD [facility] [phone]

## 2021-03-27 ENCOUNTER — Other Ambulatory Visit: Payer: Self-pay | Admitting: Urology

## 2021-03-27 ENCOUNTER — Encounter: Payer: Self-pay | Admitting: *Deleted

## 2021-03-27 ENCOUNTER — Other Ambulatory Visit: Payer: Self-pay | Admitting: *Deleted

## 2021-03-27 DIAGNOSIS — R911 Solitary pulmonary nodule: Secondary | ICD-10-CM

## 2021-04-06 ENCOUNTER — Telehealth: Payer: Self-pay

## 2021-04-06 NOTE — Telephone Encounter (Signed)
FMLA form completed and faxed to [phone]. Per pt's request. Beginning leave 04/16/21 through approx RTW date 06/19/21. Patient will pick up form from front desk

## 2021-04-13 NOTE — Progress Notes (Signed)
Surgical Instructions    Your procedure is scheduled on 04/18/21.  Report to [facility] Main Entrance "A" at 06:30 A.M., then check in with the Admitting office.  Call this number if you have problems the morning of surgery:  [phone]   If you have any questions prior to your surgery date call [phone]: Open Monday-Friday 8am-4pm    Remember:  Do not eat or drink after midnight the night before your surgery     Take these medicines the morning of surgery with A SIP OF WATER  norethindrone (NORLYDA) topiramate (TOPAMAX)   As of today, STOP taking any Aspirin (unless otherwise instructed by your surgeon) Aleve, Naproxen, Ibuprofen, Motrin, Advil, Goody's, BC's, all herbal medications, fish oil, and all vitamins.                     Do not wear jewelry, make up, or nail polish            Do not wear lotions, powders, perfumes/colognes, or deodorant.            Do not shave 48 hours prior to surgery.              Do not bring valuables to the hospital.            [facility] is not responsible for any belongings or valuables.  Do NOT Smoke (Tobacco/Vaping) or drink Alcohol 24 hours prior to your procedure If you use a CPAP at night, you may bring all equipment for your overnight stay.   Contacts, glasses, dentures or bridgework may not be worn into surgery, please bring cases for these belongings   For patients admitted to the hospital, discharge time will be determined by your treatment team.   Patients discharged the day of surgery will not be allowed to drive home, and someone needs to stay with them for 24 hours.    Special instructions:   [place]- Preparing For Surgery  Before surgery, you can play an important role. Because skin is not sterile, your skin needs to be as free of germs as possible. You can reduce the number of germs on your skin by washing with CHG (chlorahexidine gluconate) Soap before surgery.  CHG is an antiseptic cleaner which kills germs and  bonds with the skin to continue killing germs even after washing.    Oral Hygiene is also important to reduce your risk of infection.  Remember - BRUSH YOUR TEETH THE MORNING OF SURGERY WITH YOUR REGULAR TOOTHPASTE  Please do not use if you have an allergy to CHG or antibacterial soaps. If your skin becomes reddened/irritated stop using the CHG.  Do not shave (including legs and underarms) for at least 48 hours prior to first CHG shower. It is OK to shave your face.  Please follow these instructions carefully.   1. Shower the NIGHT BEFORE SURGERY and the MORNING OF SURGERY  2. If you chose to wash your hair, wash your hair first as usual with your normal shampoo.  3. After you shampoo, rinse your hair and body thoroughly to remove the shampoo.  4. Wash Face and genitals (private parts) with your normal soap.   5.  Shower the NIGHT BEFORE SURGERY and the MORNING OF SURGERY with CHG Soap.   6. Use CHG Soap as you would any other liquid soap. You can apply CHG directly to the skin and wash gently with a scrungie or a clean washcloth.   7. Apply the  CHG Soap to your body ONLY FROM THE NECK DOWN.  Do not use on open wounds or open sores. Avoid contact with your eyes, ears, mouth and genitals (private parts). Wash Face and genitals (private parts)  with your normal soap.   8. Wash thoroughly, paying special attention to the area where your surgery will be performed.  9. Thoroughly rinse your body with warm water from the neck down.  10. DO NOT shower/wash with your normal soap after using and rinsing off the CHG Soap.  11. Pat yourself dry with a CLEAN TOWEL.  12. Wear CLEAN PAJAMAS to bed the night before surgery  13. Place CLEAN SHEETS on your bed the night before your surgery  14. DO NOT SLEEP WITH PETS.   Day of Surgery: Take a shower with CHG soap. Wear Clean/Comfortable clothing the morning of surgery Do not apply any deodorants/lotions.   Remember to brush your teeth WITH  YOUR REGULAR TOOTHPASTE.   Please read over the following fact sheets that you were given.

## 2021-04-15 DIAGNOSIS — C3490 Malignant neoplasm of unspecified part of unspecified bronchus or lung: Secondary | ICD-10-CM

## 2021-04-15 HISTORY — DX: Malignant neoplasm of unspecified part of unspecified bronchus or lung: C34.90

## 2021-04-16 ENCOUNTER — Other Ambulatory Visit (HOSPITAL_COMMUNITY)
Admission: RE | Admit: 2021-04-16 | Discharge: 2021-04-16 | Disposition: A | Discharge status: Home / Self Care | Payer: BC Managed Care – PPO | Source: Ambulatory Visit | Attending: Thoracic Surgery (Cardiothoracic Vascular Surgery) | Admitting: Thoracic Surgery (Cardiothoracic Vascular Surgery)

## 2021-04-16 ENCOUNTER — Encounter (HOSPITAL_COMMUNITY)
Admission: RE | Admit: 2021-04-16 | Discharge: 2021-04-16 | Disposition: A | Discharge status: Home / Self Care | Payer: BC Managed Care – PPO | Source: Ambulatory Visit | Attending: Thoracic Surgery (Cardiothoracic Vascular Surgery) | Admitting: Thoracic Surgery (Cardiothoracic Vascular Surgery)

## 2021-04-16 ENCOUNTER — Ambulatory Visit (HOSPITAL_COMMUNITY)
Admission: RE | Admit: 2021-04-16 | Discharge: 2021-04-16 | Disposition: A | Discharge status: Home / Self Care | Payer: BC Managed Care – PPO | Source: Ambulatory Visit | Attending: Thoracic Surgery (Cardiothoracic Vascular Surgery) | Admitting: Thoracic Surgery (Cardiothoracic Vascular Surgery)

## 2021-04-16 ENCOUNTER — Other Ambulatory Visit: Payer: Self-pay

## 2021-04-16 ENCOUNTER — Encounter (HOSPITAL_COMMUNITY): Payer: Self-pay

## 2021-04-16 DIAGNOSIS — Z79899 Other long term (current) drug therapy: Secondary | ICD-10-CM | POA: Diagnosis not present

## 2021-04-16 DIAGNOSIS — Z902 Acquired absence of lung [part of]: Secondary | ICD-10-CM | POA: Diagnosis not present

## 2021-04-16 DIAGNOSIS — R911 Solitary pulmonary nodule: Secondary | ICD-10-CM | POA: Diagnosis not present

## 2021-04-16 DIAGNOSIS — Z4682 Encounter for fitting and adjustment of non-vascular catheter: Secondary | ICD-10-CM | POA: Diagnosis not present

## 2021-04-16 DIAGNOSIS — J90 Pleural effusion, not elsewhere classified: Secondary | ICD-10-CM | POA: Diagnosis not present

## 2021-04-16 DIAGNOSIS — Z85828 Personal history of other malignant neoplasm of skin: Secondary | ICD-10-CM | POA: Diagnosis not present

## 2021-04-16 DIAGNOSIS — C3431 Malignant neoplasm of lower lobe, right bronchus or lung: Secondary | ICD-10-CM | POA: Diagnosis not present

## 2021-04-16 DIAGNOSIS — Z9889 Other specified postprocedural states: Secondary | ICD-10-CM | POA: Diagnosis not present

## 2021-04-16 DIAGNOSIS — C3491 Malignant neoplasm of unspecified part of right bronchus or lung: Secondary | ICD-10-CM | POA: Diagnosis not present

## 2021-04-16 DIAGNOSIS — J9811 Atelectasis: Secondary | ICD-10-CM | POA: Diagnosis not present

## 2021-04-16 DIAGNOSIS — C771 Secondary and unspecified malignant neoplasm of intrathoracic lymph nodes: Secondary | ICD-10-CM | POA: Diagnosis not present

## 2021-04-16 DIAGNOSIS — Z01818 Encounter for other preprocedural examination: Secondary | ICD-10-CM

## 2021-04-16 DIAGNOSIS — G43909 Migraine, unspecified, not intractable, without status migrainosus: Secondary | ICD-10-CM | POA: Diagnosis not present

## 2021-04-16 DIAGNOSIS — J439 Emphysema, unspecified: Secondary | ICD-10-CM | POA: Diagnosis not present

## 2021-04-16 DIAGNOSIS — Z9011 Acquired absence of right breast and nipple: Secondary | ICD-10-CM | POA: Diagnosis not present

## 2021-04-16 DIAGNOSIS — N390 Urinary tract infection, site not specified: Secondary | ICD-10-CM | POA: Diagnosis not present

## 2021-04-16 DIAGNOSIS — Z87440 Personal history of urinary (tract) infections: Secondary | ICD-10-CM | POA: Diagnosis not present

## 2021-04-16 DIAGNOSIS — Z20822 Contact with and (suspected) exposure to covid-19: Secondary | ICD-10-CM | POA: Diagnosis not present

## 2021-04-16 DIAGNOSIS — Z8616 Personal history of COVID-19: Secondary | ICD-10-CM | POA: Diagnosis not present

## 2021-04-16 DIAGNOSIS — J939 Pneumothorax, unspecified: Secondary | ICD-10-CM | POA: Diagnosis not present

## 2021-04-16 HISTORY — DX: Other specified postprocedural states: R11.2

## 2021-04-16 HISTORY — DX: Other specified postprocedural states: Z98.890

## 2021-04-16 LAB — BLOOD GAS, ARTERIAL
Acid-base deficit: 4.7 mmol/L — ABNORMAL HIGH (ref 0.0–2.0)
Bicarbonate: 19.3 mmol/L — ABNORMAL LOW (ref 20.0–28.0)
Drawn by: 602861
FIO2: 21
O2 Saturation: 98.5 %
Patient temperature: 37
pCO2 arterial: 32.1 mmHg (ref 32.0–48.0)
pH, Arterial: 7.395 (ref 7.350–7.450)
pO2, Arterial: 118 mmHg — ABNORMAL HIGH (ref 83.0–108.0)

## 2021-04-16 LAB — URINALYSIS, ROUTINE W REFLEX MICROSCOPIC
Bilirubin Urine: NEGATIVE
Glucose, UA: NEGATIVE mg/dL
Ketones, ur: NEGATIVE mg/dL
Leukocytes,Ua: NEGATIVE
Nitrite: NEGATIVE
Protein, ur: NEGATIVE mg/dL
Specific Gravity, Urine: 1.003 — ABNORMAL LOW (ref 1.005–1.030)
pH: 7 (ref 5.0–8.0)

## 2021-04-16 LAB — COMPREHENSIVE METABOLIC PANEL
ALT: 9 U/L (ref 0–44)
AST: 15 U/L (ref 15–41)
Albumin: 4 g/dL (ref 3.5–5.0)
Alkaline Phosphatase: 56 U/L (ref 38–126)
Anion gap: 8 (ref 5–15)
BUN: 7 mg/dL (ref 6–20)
CO2: 18 mmol/L — ABNORMAL LOW (ref 22–32)
Calcium: 8.5 mg/dL — ABNORMAL LOW (ref 8.9–10.3)
Chloride: 110 mmol/L (ref 98–111)
Creatinine, Ser: 0.74 mg/dL (ref 0.44–1.00)
GFR, Estimated: >60 mL/min (ref >60)
Glucose, Bld: 110 mg/dL — ABNORMAL HIGH (ref 70–99)
Potassium: 3.8 mmol/L (ref 3.5–5.1)
Sodium: 136 mmol/L (ref 135–145)
Total Bilirubin: 0.5 mg/dL (ref 0.3–1.2)
Total Protein: 6.2 g/dL — ABNORMAL LOW (ref 6.5–8.1)

## 2021-04-16 LAB — CBC
HCT: 36.3 % (ref 36.0–46.0)
Hemoglobin: 12.6 g/dL (ref 12.0–15.0)
MCH: 32.9 pg (ref 26.0–34.0)
MCHC: 34.7 g/dL (ref 30.0–36.0)
MCV: 94.8 fL (ref 80.0–100.0)
Platelets: 254 10*3/uL (ref 150–400)
RBC: 3.83 MIL/uL — ABNORMAL LOW (ref 3.87–5.11)
RDW: 11.6 % (ref 11.5–15.5)
WBC: 5 10*3/uL (ref 4.0–10.5)
nRBC: 0 % (ref 0.0–0.2)

## 2021-04-16 LAB — SARS CORONAVIRUS 2 (TAT 6-24 HRS): SARS Coronavirus 2: NEGATIVE

## 2021-04-16 LAB — PROTIME-INR
INR: 1.1 (ref 0.8–1.2)
Prothrombin Time: 14.6 seconds (ref 11.4–15.2)

## 2021-04-16 LAB — APTT: aPTT: 30 seconds (ref 24–36)

## 2021-04-16 LAB — SURGICAL PCR SCREEN
MRSA, PCR: NEGATIVE
Staphylococcus aureus: NEGATIVE

## 2021-04-16 NOTE — Progress Notes (Signed)
PCP - [facility] (erin carroll) Cardiologist - denies  PPM/ICD - denies  Chest x-ray - 04/16/21 EKG - 04/16/21 Stress Test - denies ECHO - denies Cardiac Cath - denies  Sleep Study - denies  No diabetes  Patient instructed to hold all Aspirin, NSAID's, herbal medications, fish oil and vitamins 7 days prior to surgery.   ERAS Protcol -no   COVID TEST- 04/16/21 at [facility]   Anesthesia review: no  Patient denies shortness of breath, fever, cough and chest pain at PAT appointment   All instructions explained to the patient, with a verbal understanding of the material. Patient agrees to go over the instructions while at home for a better understanding. Patient also instructed to self quarantine after being tested for COVID-19. The opportunity to ask questions was provided.

## 2021-04-16 NOTE — Progress Notes (Signed)
Notified Levonne Spiller and Dr. Roxan Hockey of urine results (rare amount of bacteria).

## 2021-04-18 ENCOUNTER — Encounter (HOSPITAL_COMMUNITY): Payer: Self-pay | Admitting: Thoracic Surgery (Cardiothoracic Vascular Surgery)

## 2021-04-18 ENCOUNTER — Inpatient Hospital Stay (HOSPITAL_COMMUNITY): Payer: BC Managed Care – PPO | Admitting: Student

## 2021-04-18 ENCOUNTER — Encounter (HOSPITAL_COMMUNITY)
Admission: RE | Disposition: A | Discharge status: Home / Self Care | Payer: Self-pay | Source: Home / Self Care | Attending: Thoracic Surgery (Cardiothoracic Vascular Surgery)

## 2021-04-18 ENCOUNTER — Inpatient Hospital Stay (HOSPITAL_COMMUNITY)
Admission: RE | Admit: 2021-04-18 | Discharge: 2021-04-22 | DRG: 164 | Disposition: A | Discharge status: Home / Self Care | Payer: BC Managed Care – PPO | Attending: Thoracic Surgery (Cardiothoracic Vascular Surgery) | Admitting: Thoracic Surgery (Cardiothoracic Vascular Surgery)

## 2021-04-18 ENCOUNTER — Other Ambulatory Visit: Payer: Self-pay

## 2021-04-18 ENCOUNTER — Inpatient Hospital Stay (HOSPITAL_COMMUNITY): Payer: BC Managed Care – PPO

## 2021-04-18 DIAGNOSIS — C3431 Malignant neoplasm of lower lobe, right bronchus or lung: Principal | ICD-10-CM | POA: Diagnosis present

## 2021-04-18 DIAGNOSIS — Z4682 Encounter for fitting and adjustment of non-vascular catheter: Secondary | ICD-10-CM

## 2021-04-18 DIAGNOSIS — Z9889 Other specified postprocedural states: Secondary | ICD-10-CM

## 2021-04-18 DIAGNOSIS — Z87440 Personal history of urinary (tract) infections: Secondary | ICD-10-CM

## 2021-04-18 DIAGNOSIS — Z79899 Other long term (current) drug therapy: Secondary | ICD-10-CM

## 2021-04-18 DIAGNOSIS — Z20822 Contact with and (suspected) exposure to covid-19: Secondary | ICD-10-CM | POA: Diagnosis present

## 2021-04-18 DIAGNOSIS — C771 Secondary and unspecified malignant neoplasm of intrathoracic lymph nodes: Secondary | ICD-10-CM | POA: Diagnosis present

## 2021-04-18 DIAGNOSIS — Z902 Acquired absence of lung [part of]: Secondary | ICD-10-CM

## 2021-04-18 DIAGNOSIS — R911 Solitary pulmonary nodule: Secondary | ICD-10-CM

## 2021-04-18 DIAGNOSIS — J939 Pneumothorax, unspecified: Secondary | ICD-10-CM

## 2021-04-18 DIAGNOSIS — Z85828 Personal history of other malignant neoplasm of skin: Secondary | ICD-10-CM | POA: Diagnosis not present

## 2021-04-18 DIAGNOSIS — Z8616 Personal history of COVID-19: Secondary | ICD-10-CM | POA: Diagnosis not present

## 2021-04-18 HISTORY — PX: INTERCOSTAL NERVE BLOCK: SHX5021

## 2021-04-18 HISTORY — PX: NODE DISSECTION: SHX5269

## 2021-04-18 LAB — HEMOGLOBIN A1C
Hgb A1c MFr Bld: 5.2 % (ref 4.8–5.6)
Mean Plasma Glucose: 102.54 mg/dL

## 2021-04-18 LAB — GLUCOSE, CAPILLARY: Glucose-Capillary: 172 mg/dL — ABNORMAL HIGH (ref 70–99)

## 2021-04-18 LAB — ABO/RH: ABO/RH(D): O POS

## 2021-04-18 LAB — PREPARE RBC (CROSSMATCH)

## 2021-04-18 LAB — POCT PREGNANCY, URINE: Preg Test, Ur: NEGATIVE

## 2021-04-18 SURGERY — WEDGE RESECTION, LUNG, ROBOT-ASSISTED, THORACOSCOPIC
Anesthesia: General | Site: Chest | Laterality: Right

## 2021-04-18 MED ORDER — LACTATED RINGERS IV SOLN: INTRAVENOUS | Status: DC | PRN

## 2021-04-18 MED ORDER — BSS IO SOLN
15.0000 mL | Freq: Once | INTRAOCULAR | Status: AC
  Administered 2021-04-18: 15 mL
  Filled 2021-04-18: qty 15

## 2021-04-18 MED ORDER — FENTANYL CITRATE (PF) 100 MCG/2ML IJ SOLN
25.0000 ug | INTRAMUSCULAR | Status: DC | PRN
  Administered 2021-04-18: 50 ug via INTRAVENOUS
  Administered 2021-04-18: 25 ug via INTRAVENOUS

## 2021-04-18 MED ORDER — PROPOFOL 500 MG/50ML IV EMUL
INTRAVENOUS | Status: DC | PRN
  Administered 2021-04-18: 20 ug/kg/min via INTRAVENOUS

## 2021-04-18 MED ORDER — FENTANYL CITRATE (PF) 100 MCG/2ML IJ SOLN
INTRAMUSCULAR | Status: AC
  Filled 2021-04-18: qty 2

## 2021-04-18 MED ORDER — ORAL CARE MOUTH RINSE: 15.0000 mL | Freq: Once | OROMUCOSAL | Status: AC

## 2021-04-18 MED ORDER — ONDANSETRON HCL 4 MG/2ML IJ SOLN: INTRAMUSCULAR | Status: DC | PRN

## 2021-04-18 MED ORDER — SENNOSIDES-DOCUSATE SODIUM 8.6-50 MG PO TABS
1.0000 | ORAL_TABLET | Freq: Every day | ORAL | Status: DC
  Administered 2021-04-19 – 2021-04-21 (×2): 1 via ORAL
  Filled 2021-04-18 (×3): qty 1

## 2021-04-18 MED ORDER — SUGAMMADEX SODIUM 200 MG/2ML IV SOLN
INTRAVENOUS | Status: DC | PRN
  Administered 2021-04-18: 110 mg via INTRAVENOUS

## 2021-04-18 MED ORDER — PROPOFOL 10 MG/ML IV BOLUS
INTRAVENOUS | Status: DC | PRN
  Administered 2021-04-18: 170 mg via INTRAVENOUS

## 2021-04-18 MED ORDER — ONDANSETRON HCL 4 MG/2ML IJ SOLN
4.0000 mg | Freq: Four times a day (QID) | INTRAMUSCULAR | Status: DC | PRN
  Administered 2021-04-18 – 2021-04-20 (×5): 4 mg via INTRAVENOUS
  Filled 2021-04-18 (×5): qty 2

## 2021-04-18 MED ORDER — 0.9 % SODIUM CHLORIDE (POUR BTL) OPTIME
TOPICAL | Status: DC | PRN
  Administered 2021-04-18: 2000 mL

## 2021-04-18 MED ORDER — FENTANYL CITRATE (PF) 250 MCG/5ML IJ SOLN
INTRAMUSCULAR | Status: AC
  Filled 2021-04-18: qty 5

## 2021-04-18 MED ORDER — ENOXAPARIN SODIUM 40 MG/0.4ML IJ SOSY: 40.0000 mg | PREFILLED_SYRINGE | INTRAMUSCULAR | Status: DC

## 2021-04-18 MED ORDER — KETOROLAC TROMETHAMINE 15 MG/ML IJ SOLN
15.0000 mg | Freq: Four times a day (QID) | INTRAMUSCULAR | Status: DC
  Administered 2021-04-18 – 2021-04-20 (×10): 15 mg via INTRAVENOUS
  Filled 2021-04-18 (×11): qty 1

## 2021-04-18 MED ORDER — FENTANYL CITRATE (PF) 100 MCG/2ML IJ SOLN
INTRAMUSCULAR | Status: DC | PRN
  Administered 2021-04-18: 50 ug via INTRAVENOUS
  Administered 2021-04-18: 100 ug via INTRAVENOUS
  Administered 2021-04-18: 50 ug via INTRAVENOUS

## 2021-04-18 MED ORDER — SODIUM CHLORIDE 0.9% IV SOLUTION: Freq: Once | INTRAVENOUS | Status: DC

## 2021-04-18 MED ORDER — ROCURONIUM BROMIDE 10 MG/ML (PF) SYRINGE
PREFILLED_SYRINGE | INTRAVENOUS | Status: AC
  Filled 2021-04-18: qty 10

## 2021-04-18 MED ORDER — DEXMEDETOMIDINE HCL IN NACL 80 MCG/20ML IV SOLN
INTRAVENOUS | Status: AC
  Filled 2021-04-18: qty 20

## 2021-04-18 MED ORDER — OXYCODONE HCL 5 MG PO TABS: 5.0000 mg | ORAL_TABLET | ORAL | Status: DC | PRN

## 2021-04-18 MED ORDER — FENTANYL CITRATE (PF) 100 MCG/2ML IJ SOLN
25.0000 ug | INTRAMUSCULAR | Status: DC | PRN
Start: 2021-04-18 — End: 2021-04-19

## 2021-04-18 MED ORDER — OXYCODONE HCL 5 MG/5ML PO SOLN: 5.0000 mg | Freq: Once | ORAL | Status: DC | PRN

## 2021-04-18 MED ORDER — SODIUM CHLORIDE 0.9 % IV SOLN: INTRAVENOUS | Status: DC

## 2021-04-18 MED ORDER — MIDAZOLAM HCL 5 MG/5ML IJ SOLN
INTRAMUSCULAR | Status: DC | PRN
  Administered 2021-04-18: 1 mg via INTRAVENOUS

## 2021-04-18 MED ORDER — LIDOCAINE 2% (20 MG/ML) 5 ML SYRINGE
INTRAMUSCULAR | Status: DC | PRN
  Administered 2021-04-18: 140 mg via INTRAVENOUS

## 2021-04-18 MED ORDER — ACETAMINOPHEN 500 MG PO TABS
1000.0000 mg | ORAL_TABLET | Freq: Four times a day (QID) | ORAL | Status: DC
  Administered 2021-04-18 – 2021-04-21 (×9): 1000 mg via ORAL
  Filled 2021-04-18 (×10): qty 2

## 2021-04-18 MED ORDER — BISACODYL 5 MG PO TBEC
10.0000 mg | DELAYED_RELEASE_TABLET | Freq: Every day | ORAL | Status: DC
  Administered 2021-04-19 – 2021-04-22 (×3): 10 mg via ORAL
  Filled 2021-04-18 (×3): qty 2

## 2021-04-18 MED ORDER — BUPIVACAINE LIPOSOME 1.3 % IJ SUSP
INTRAMUSCULAR | Status: AC
  Filled 2021-04-18: qty 20

## 2021-04-18 MED ORDER — DEXAMETHASONE SODIUM PHOSPHATE 10 MG/ML IJ SOLN
INTRAMUSCULAR | Status: DC | PRN
  Administered 2021-04-18: 10 mg via INTRAVENOUS

## 2021-04-18 MED ORDER — ONDANSETRON HCL 4 MG/2ML IJ SOLN: 4.0000 mg | Freq: Once | INTRAMUSCULAR | Status: DC | PRN

## 2021-04-18 MED ORDER — TRAMADOL HCL 50 MG PO TABS
50.0000 mg | ORAL_TABLET | Freq: Four times a day (QID) | ORAL | Status: DC | PRN
  Administered 2021-04-18 – 2021-04-19 (×2): 100 mg via ORAL
  Administered 2021-04-19: 50 mg via ORAL
  Filled 2021-04-18 (×3): qty 2

## 2021-04-18 MED ORDER — HEMOSTATIC AGENTS (NO CHARGE) OPTIME
TOPICAL | Status: DC | PRN
  Administered 2021-04-18 (×2): 1 via TOPICAL

## 2021-04-18 MED ORDER — LIDOCAINE HCL (CARDIAC) PF 100 MG/5ML IV SOSY
PREFILLED_SYRINGE | INTRAVENOUS | Status: DC | PRN
  Administered 2021-04-18: 40 mg via INTRAVENOUS

## 2021-04-18 MED ORDER — CHLORHEXIDINE GLUCONATE CLOTH 2 % EX PADS: 6.0000 | MEDICATED_PAD | Freq: Every day | CUTANEOUS | Status: DC

## 2021-04-18 MED ORDER — NORETHINDRONE 0.35 MG PO TABS: 1.0000 | ORAL_TABLET | Freq: Every day | ORAL | Status: DC

## 2021-04-18 MED ORDER — PANTOPRAZOLE SODIUM 40 MG PO TBEC
40.0000 mg | DELAYED_RELEASE_TABLET | Freq: Every day | ORAL | Status: DC
  Administered 2021-04-19 – 2021-04-22 (×4): 40 mg via ORAL
  Filled 2021-04-18 (×4): qty 1

## 2021-04-18 MED ORDER — PHENYLEPHRINE 40 MCG/ML (10ML) SYRINGE FOR IV PUSH (FOR BLOOD PRESSURE SUPPORT)
PREFILLED_SYRINGE | INTRAVENOUS | Status: AC
  Filled 2021-04-18: qty 10

## 2021-04-18 MED ORDER — INSULIN ASPART 100 UNIT/ML IJ SOLN: 0.0000 [IU] | Freq: Three times a day (TID) | INTRAMUSCULAR | Status: DC

## 2021-04-18 MED ORDER — ROCURONIUM BROMIDE 100 MG/10ML IV SOLN
INTRAVENOUS | Status: DC | PRN
  Administered 2021-04-18: 20 mg via INTRAVENOUS
  Administered 2021-04-18: 5 mg via INTRAVENOUS
  Administered 2021-04-18: 50 mg via INTRAVENOUS
  Administered 2021-04-18: 10 mg via INTRAVENOUS

## 2021-04-18 MED ORDER — SODIUM CHLORIDE FLUSH 0.9 % IV SOLN
INTRAVENOUS | Status: DC | PRN
  Administered 2021-04-18: 95 mL

## 2021-04-18 MED ORDER — LACTATED RINGERS IV SOLN: INTRAVENOUS | Status: DC

## 2021-04-18 MED ORDER — KETOROLAC TROMETHAMINE 0.5 % OP SOLN
1.0000 [drp] | OPHTHALMIC | Status: DC | PRN
  Administered 2021-04-18: 1 [drp] via OPHTHALMIC

## 2021-04-18 MED ORDER — KETOROLAC TROMETHAMINE 15 MG/ML IJ SOLN
INTRAMUSCULAR | Status: DC | PRN
  Administered 2021-04-18: 15 mg via INTRAVENOUS

## 2021-04-18 MED ORDER — ONDANSETRON HCL 4 MG/2ML IJ SOLN
INTRAMUSCULAR | Status: AC
  Filled 2021-04-18: qty 2

## 2021-04-18 MED ORDER — POLYVINYL ALCOHOL 1.4 % OP SOLN
1.0000 [drp] | OPHTHALMIC | Status: DC | PRN
  Administered 2021-04-18 – 2021-04-20 (×5): 1 [drp] via OPHTHALMIC
  Filled 2021-04-18: qty 15

## 2021-04-18 MED ORDER — CEFAZOLIN SODIUM-DEXTROSE 2-4 GM/100ML-% IV SOLN
2.0000 g | Freq: Three times a day (TID) | INTRAVENOUS | Status: AC
  Administered 2021-04-18 – 2021-04-19 (×2): 2 g via INTRAVENOUS
  Filled 2021-04-18 (×2): qty 100

## 2021-04-18 MED ORDER — PROPOFOL 10 MG/ML IV BOLUS
INTRAVENOUS | Status: AC
  Filled 2021-04-18: qty 40

## 2021-04-18 MED ORDER — CHLORHEXIDINE GLUCONATE 0.12 % MT SOLN
15.0000 mL | Freq: Once | OROMUCOSAL | Status: AC
  Administered 2021-04-18: 15 mL via OROMUCOSAL
  Filled 2021-04-18: qty 15

## 2021-04-18 MED ORDER — OXYCODONE HCL 5 MG PO TABS: 5.0000 mg | ORAL_TABLET | Freq: Once | ORAL | Status: DC | PRN

## 2021-04-18 MED ORDER — LIDOCAINE 2% (20 MG/ML) 5 ML SYRINGE
INTRAMUSCULAR | Status: AC
  Filled 2021-04-18: qty 5

## 2021-04-18 MED ORDER — INDOCYANINE GREEN 25 MG IV SOLR
INTRAVENOUS | Status: DC | PRN
  Administered 2021-04-18: 25 mg via INTRAVENOUS

## 2021-04-18 MED ORDER — ENOXAPARIN SODIUM 40 MG/0.4ML IJ SOSY
40.0000 mg | PREFILLED_SYRINGE | INTRAMUSCULAR | Status: DC
  Administered 2021-04-18 – 2021-04-21 (×4): 40 mg via SUBCUTANEOUS
  Filled 2021-04-18 (×4): qty 0.4

## 2021-04-18 MED ORDER — KETOROLAC TROMETHAMINE 0.5 % OP SOLN
OPHTHALMIC | Status: AC
  Filled 2021-04-18: qty 5

## 2021-04-18 MED ORDER — POLYMYXIN B-TRIMETHOPRIM 10000-0.1 UNIT/ML-% OP SOLN
1.0000 [drp] | OPHTHALMIC | Status: AC
  Administered 2021-04-18 – 2021-04-19 (×6): 1 [drp] via OPHTHALMIC
  Filled 2021-04-18: qty 10

## 2021-04-18 MED ORDER — ACETAMINOPHEN 160 MG/5ML PO SOLN: 1000.0000 mg | Freq: Four times a day (QID) | ORAL | Status: DC

## 2021-04-18 MED ORDER — SODIUM CHLORIDE 0.9 % IV SOLN
12.5000 mg | Freq: Four times a day (QID) | INTRAVENOUS | Status: DC | PRN
  Administered 2021-04-18 – 2021-04-20 (×2): 12.5 mg via INTRAVENOUS
  Filled 2021-04-18 (×3): qty 0.5

## 2021-04-18 MED ORDER — ROCURONIUM BROMIDE 10 MG/ML (PF) SYRINGE
PREFILLED_SYRINGE | INTRAVENOUS | Status: DC | PRN
  Administered 2021-04-18: 50 mg via INTRAVENOUS

## 2021-04-18 MED ORDER — KETOROLAC TROMETHAMINE 0.5 % OP SOLN
1.0000 [drp] | Freq: Three times a day (TID) | OPHTHALMIC | Status: AC | PRN
  Administered 2021-04-18: 1 [drp] via OPHTHALMIC
  Filled 2021-04-18: qty 5

## 2021-04-18 MED ORDER — CHLORHEXIDINE GLUCONATE CLOTH 2 % EX PADS
6.0000 | MEDICATED_PAD | Freq: Every day | CUTANEOUS | Status: DC
  Administered 2021-04-20 – 2021-04-22 (×3): 6 via TOPICAL

## 2021-04-18 MED ORDER — INSULIN ASPART 100 UNIT/ML IJ SOLN
0.0000 [IU] | INTRAMUSCULAR | Status: DC
  Administered 2021-04-18: 4 [IU] via SUBCUTANEOUS

## 2021-04-18 MED ORDER — MIDAZOLAM HCL 2 MG/2ML IJ SOLN
INTRAMUSCULAR | Status: AC
  Filled 2021-04-18: qty 2

## 2021-04-18 MED ORDER — PHENYLEPHRINE HCL (PRESSORS) 10 MG/ML IV SOLN
INTRAVENOUS | Status: DC | PRN
  Administered 2021-04-18: 80 ug via INTRAVENOUS

## 2021-04-18 MED ORDER — ONDANSETRON HCL 4 MG/2ML IJ SOLN
INTRAMUSCULAR | Status: DC | PRN
  Administered 2021-04-18: 4 mg via INTRAVENOUS

## 2021-04-18 MED ORDER — BUPIVACAINE HCL (PF) 0.5 % IJ SOLN
INTRAMUSCULAR | Status: AC
  Filled 2021-04-18: qty 30

## 2021-04-18 MED ORDER — SODIUM CHLORIDE 0.9 % IV SOLN
INTRAVENOUS | Status: DC | PRN
  Administered 2021-04-18: 35 ug/min via INTRAVENOUS

## 2021-04-18 MED ORDER — INDOCYANINE GREEN 25 MG IV SOLR
INTRAVENOUS | Status: AC
  Filled 2021-04-18: qty 10

## 2021-04-18 MED ORDER — DEXAMETHASONE SODIUM PHOSPHATE 10 MG/ML IJ SOLN
INTRAMUSCULAR | Status: AC
  Filled 2021-04-18: qty 1

## 2021-04-18 MED ORDER — CEFAZOLIN SODIUM-DEXTROSE 2-4 GM/100ML-% IV SOLN
2.0000 g | INTRAVENOUS | Status: AC
  Administered 2021-04-18 (×2): 2 g via INTRAVENOUS
  Filled 2021-04-18: qty 100

## 2021-04-18 MED ORDER — SODIUM CHLORIDE 0.9 % IV SOLN
INTRAVENOUS | Status: AC | PRN
  Administered 2021-04-18: 1000 mL via INTRAMUSCULAR

## 2021-04-18 MED ORDER — TOPIRAMATE 25 MG PO TABS
25.0000 mg | ORAL_TABLET | Freq: Every day | ORAL | Status: DC
  Administered 2021-04-19 – 2021-04-22 (×4): 25 mg via ORAL
  Filled 2021-04-18 (×4): qty 1

## 2021-04-18 SURGICAL SUPPLY — 122 items
ADH SKN CLS APL DERMABOND .7 (GAUZE/BANDAGES/DRESSINGS) ×1
APPLIER CLIP ROT 10 11.4 M/L (STAPLE)
APR CLP MED LRG 11.4X10 (STAPLE)
BAG SPEC RTRVL C125 8X14 (MISCELLANEOUS) ×1
BLADE CLIPPER SURG (BLADE) ×2 IMPLANT
BNDG COHESIVE 6X5 TAN STRL LF (GAUZE/BANDAGES/DRESSINGS) IMPLANT
CANISTER SUCT 3000ML PPV (MISCELLANEOUS) ×2 IMPLANT
CANNULA REDUC XI 12-8 STAPL (CANNULA) ×3
CANNULA REDUCER 12-8 DVNC XI (CANNULA) ×3 IMPLANT
CATH THORACIC 28FR (CATHETERS) IMPLANT
CATH THORACIC 28FR RT ANG (CATHETERS) IMPLANT
CATH THORACIC 36FR (CATHETERS) IMPLANT
CATH THORACIC 36FR RT ANG (CATHETERS) IMPLANT
CLIP APPLIE ROT 10 11.4 M/L (STAPLE) IMPLANT
CLIP VESOCCLUDE MED 6/CT (CLIP) IMPLANT
CNTNR URN SCR LID CUP LEK RST (MISCELLANEOUS) ×10 IMPLANT
CONN ST 1/4X3/8  BEN (MISCELLANEOUS) ×1
CONN ST 1/4X3/8 BEN (MISCELLANEOUS) ×1 IMPLANT
CONT SPEC 4OZ STRL OR WHT (MISCELLANEOUS) ×20
DECANTER SPIKE VIAL GLASS SM (MISCELLANEOUS) ×2 IMPLANT
DEFOGGER SCOPE WARMER CLEARIFY (MISCELLANEOUS) ×2 IMPLANT
DERMABOND ADVANCED (GAUZE/BANDAGES/DRESSINGS) ×1
DERMABOND ADVANCED .7 DNX12 (GAUZE/BANDAGES/DRESSINGS) ×1 IMPLANT
DRAIN CHANNEL 28F RND 3/8 FF (WOUND CARE) ×2 IMPLANT
DRAIN CHANNEL 32F RND 10.7 FF (WOUND CARE) IMPLANT
DRAPE ARM DVNC X/XI (DISPOSABLE) ×4 IMPLANT
DRAPE COLUMN DVNC XI (DISPOSABLE) ×1 IMPLANT
DRAPE CV SPLIT W-CLR ANES SCRN (DRAPES) ×2 IMPLANT
DRAPE DA VINCI XI ARM (DISPOSABLE) ×4
DRAPE DA VINCI XI COLUMN (DISPOSABLE) ×1
DRAPE INCISE IOBAN 66X45 STRL (DRAPES) IMPLANT
DRAPE ORTHO SPLIT 77X108 STRL (DRAPES) ×2
DRAPE SURG ORHT 6 SPLT 77X108 (DRAPES) ×1 IMPLANT
ELECT BLADE 6.5 EXT (BLADE) IMPLANT
ELECT REM PT RETURN 9FT ADLT (ELECTROSURGICAL) ×2
ELECTRODE REM PT RTRN 9FT ADLT (ELECTROSURGICAL) ×1 IMPLANT
GAUZE KITTNER 4X5 RF (MISCELLANEOUS) ×8 IMPLANT
GAUZE SPONGE 4X4 12PLY STRL (GAUZE/BANDAGES/DRESSINGS) ×2 IMPLANT
GLOVE SURG POLYISO LF SZ6 (GLOVE) ×2 IMPLANT
GLOVE SURG SIGNA 7.5 PF LTX (GLOVE) ×6 IMPLANT
GLOVE SURG UNDER POLY LF SZ6.5 (GLOVE) ×4 IMPLANT
GLOVE SURG UNDER POLY LF SZ7 (GLOVE) ×4 IMPLANT
GLOVE TRIUMPH SURG SIZE 7.5 (KITS) ×4 IMPLANT
GOWN STRL REUS W/ TWL LRG LVL3 (GOWN DISPOSABLE) ×2 IMPLANT
GOWN STRL REUS W/ TWL XL LVL3 (GOWN DISPOSABLE) ×3 IMPLANT
GOWN STRL REUS W/TWL 2XL LVL3 (GOWN DISPOSABLE) ×4 IMPLANT
GOWN STRL REUS W/TWL LRG LVL3 (GOWN DISPOSABLE) ×4
GOWN STRL REUS W/TWL XL LVL3 (GOWN DISPOSABLE) ×6
HEMOSTAT SURGICEL 2X14 (HEMOSTASIS) ×2 IMPLANT
IRRIGATION STRYKERFLOW (MISCELLANEOUS) ×1 IMPLANT
IRRIGATOR STRYKERFLOW (MISCELLANEOUS) ×2
KIT BASIN OR (CUSTOM PROCEDURE TRAY) ×2 IMPLANT
KIT SUCTION CATH 14FR (SUCTIONS) IMPLANT
KIT TURNOVER KIT B (KITS) ×2 IMPLANT
LOOP VESSEL SUPERMAXI WHITE (MISCELLANEOUS) IMPLANT
NEEDLE HYPO 25GX1X1/2 BEV (NEEDLE) ×2 IMPLANT
NEEDLE SPNL 22GX3.5 QUINCKE BK (NEEDLE) ×2 IMPLANT
NS IRRIG 1000ML POUR BTL (IV SOLUTION) ×4 IMPLANT
OBTURATOR OPTICAL STANDARD 8MM (TROCAR)
OBTURATOR OPTICAL STND 8 DVNC (TROCAR)
OBTURATOR OPTICALSTD 8 DVNC (TROCAR) IMPLANT
PACK CHEST (CUSTOM PROCEDURE TRAY) ×2 IMPLANT
PAD ARMBOARD 7.5X6 YLW CONV (MISCELLANEOUS) ×4 IMPLANT
PORT ACCESS TROCAR AIRSEAL 12 (TROCAR) ×1 IMPLANT
PORT ACCESS TROCAR AIRSEAL 5M (TROCAR) ×1
PROGEL SPRAY TIP 11IN (MISCELLANEOUS) ×2
RELOAD STAPLER 2.5X45 WHT DVNC (STAPLE) ×4 IMPLANT
RELOAD STAPLER 3.5X45 BLU DVNC (STAPLE) ×7 IMPLANT
RELOAD STAPLER 4.3X45 GRN DVNC (STAPLE) ×4 IMPLANT
SCISSORS LAP 5X35 DISP (ENDOMECHANICALS) IMPLANT
SEAL CANN UNIV 5-8 DVNC XI (MISCELLANEOUS) ×2 IMPLANT
SEAL XI 5MM-8MM UNIVERSAL (MISCELLANEOUS) ×2
SEALANT PROGEL (MISCELLANEOUS) ×2 IMPLANT
SEALANT SURG COSEAL 4ML (VASCULAR PRODUCTS) IMPLANT
SEALANT SURG COSEAL 8ML (VASCULAR PRODUCTS) IMPLANT
SET TUBE SMOKE EVAC HIGH FLOW (TUBING) ×2 IMPLANT
SOLUTION ELECTROLUBE (MISCELLANEOUS) IMPLANT
SPECIMEN JAR MEDIUM (MISCELLANEOUS) IMPLANT
SPONGE INTESTINAL PEANUT (DISPOSABLE) IMPLANT
SPONGE TONSIL TAPE 1 RFD (DISPOSABLE) IMPLANT
STAPLER 45 DA VINCI SURE FORM (STAPLE) ×1
STAPLER 45 SUREFORM CVD (STAPLE) ×1
STAPLER 45 SUREFORM CVD DVNC (STAPLE) ×1 IMPLANT
STAPLER 45 SUREFORM DVNC (STAPLE) ×1 IMPLANT
STAPLER CANNULA SEAL DVNC XI (STAPLE) ×2 IMPLANT
STAPLER CANNULA SEAL XI (STAPLE) ×2
STAPLER RELOAD 2.5X45 WHITE (STAPLE) ×4
STAPLER RELOAD 2.5X45 WHT DVNC (STAPLE) ×4
STAPLER RELOAD 3.5X45 BLU DVNC (STAPLE) ×7
STAPLER RELOAD 3.5X45 BLUE (STAPLE) ×7
STAPLER RELOAD 4.3X45 GREEN (STAPLE) ×4
STAPLER RELOAD 4.3X45 GRN DVNC (STAPLE) ×4
SUT PDS AB 3-0 SH 27 (SUTURE) IMPLANT
SUT PROLENE 4 0 RB 1 (SUTURE)
SUT PROLENE 4-0 RB1 .5 CRCL 36 (SUTURE) IMPLANT
SUT SILK  1 MH (SUTURE) ×2
SUT SILK 1 MH (SUTURE) ×2 IMPLANT
SUT SILK 1 TIES 10X30 (SUTURE) ×2 IMPLANT
SUT SILK 2 0 SH (SUTURE) IMPLANT
SUT SILK 2 0SH CR/8 30 (SUTURE) IMPLANT
SUT SILK 3 0SH CR/8 30 (SUTURE) IMPLANT
SUT VIC AB 1 CTX 36 (SUTURE) ×2
SUT VIC AB 1 CTX36XBRD ANBCTR (SUTURE) ×1 IMPLANT
SUT VIC AB 2-0 CTX 36 (SUTURE) IMPLANT
SUT VIC AB 3-0 MH 27 (SUTURE) IMPLANT
SUT VIC AB 3-0 X1 27 (SUTURE) ×2 IMPLANT
SUT VICRYL 0 TIES 12 18 (SUTURE) ×2 IMPLANT
SUT VICRYL 0 UR6 27IN ABS (SUTURE) ×2 IMPLANT
SUT VICRYL 2 TP 1 (SUTURE) IMPLANT
SYR 20ML LL LF (SYRINGE) ×4 IMPLANT
SYSTEM RETRIEVAL ANCHOR 8 (MISCELLANEOUS) ×2 IMPLANT
SYSTEM SAHARA CHEST DRAIN ATS (WOUND CARE) ×2 IMPLANT
TAPE CLOTH 4X10 WHT NS (GAUZE/BANDAGES/DRESSINGS) ×2 IMPLANT
TAPE CLOTH SURG 4X10 WHT LF (GAUZE/BANDAGES/DRESSINGS) ×2 IMPLANT
TIP APPLICATOR SPRAY EXTEND 16 (VASCULAR PRODUCTS) ×2 IMPLANT
TIP SPRAY PROGEL 11IN (MISCELLANEOUS) ×1 IMPLANT
TOWEL GREEN STERILE (TOWEL DISPOSABLE) ×4 IMPLANT
TRAY FOLEY MTR SLVR 16FR STAT (SET/KITS/TRAYS/PACK) ×2 IMPLANT
TROCAR BLADELESS 15MM (ENDOMECHANICALS) IMPLANT
TROCAR XCEL 12X100 BLDLESS (ENDOMECHANICALS) ×2 IMPLANT
TROCAR XCEL BLADELESS 5X75MML (TROCAR) IMPLANT
WATER STERILE IRR 1000ML POUR (IV SOLUTION) ×4 IMPLANT

## 2021-04-18 NOTE — Anesthesia Postprocedure Evaluation (Signed)
Anesthesia Post Note  Patient: Kathleen Hughes  Procedure(s) Performed: XI ROBOTIC ASSISTED THORASCOPY-WEDGE RESECTION WITH BASILAR SEGMENTECTOMY (Right Chest) INTERCOSTAL NERVE BLOCK (Right Chest) NODE DISSECTION (Right Chest)     Patient location during evaluation: PACU Anesthesia Type: General Level of consciousness: awake and alert Pain management: pain level controlled Vital Signs Assessment: post-procedure vital signs reviewed and stable Respiratory status: spontaneous breathing, nonlabored ventilation, respiratory function stable and patient connected to nasal cannula oxygen Cardiovascular status: blood pressure returned to baseline and stable Postop Assessment: no apparent nausea or vomiting Anesthetic complications: no   No complications documented.  Last Vitals:  Vitals:   04/18/21 1715 04/18/21 1800  BP: 120/84 110/77  Pulse:    Resp: (!) 21 15  Temp:    SpO2: 100% 100%    Last Pain:  Vitals:   04/18/21 1619  TempSrc: Oral  PainSc:                  Donnielle Addison COKER

## 2021-04-18 NOTE — Anesthesia Procedure Notes (Signed)
Procedure Name: Intubation Date/Time: 04/18/2021 8:37 AM Performed by: Glynda Jaeger, CRNA Pre-anesthesia Checklist: Patient identified, Emergency Drugs available, Suction available and Patient being monitored Patient Re-evaluated:Patient Re-evaluated prior to induction Oxygen Delivery Method: Circle system utilized Preoxygenation: Pre-oxygenation with 100% oxygen Induction Type: IV induction and Rapid sequence Laryngoscope Size: Mac and 3 Grade View: Grade I Tube type: Oral Endobronchial tube: Left, Double lumen EBT, EBT position confirmed by fiberoptic bronchoscope and EBT position confirmed by auscultation Tube size (mm): 61f. Number of attempts: 1 Airway Equipment and Method: Stylet Placement Confirmation: ETT inserted through vocal cords under direct vision,  positive ETCO2,  CO2 detector and breath sounds checked- equal and bilateral Secured at: 27 cm Tube secured with: Tape Dental Injury: Teeth and Oropharynx as per pre-operative assessment  Comments: Performed by SJacqualine Code

## 2021-04-18 NOTE — Progress Notes (Signed)
Anesthesiology Note:  45 year old female with R. Lower lobe mass underwent L. robotic assisted VATs and wedge resection. Anesthetic course uneventful. In PACU, patient complaining of L. eye pain and irritation.  Exam: mild L. conjunctival erythema, pupils equal and reactive normally to light, vision grossly normal.  Impression: probable mild L. corneal abrasion  Plan:  1. irrigate L. Eye with 15 cc BSS 2. Ketorolac eye drops Q4-6 hours 3. Eye patch for comfort 4. TMP/SMX eyedrops X 1  5. Follow-up in AM, consider ophthalmology consult if symptoms do not resolve in 12-24 hours.  Roberts Gaudy

## 2021-04-18 NOTE — Anesthesia Preprocedure Evaluation (Signed)
Anesthesia Evaluation  Patient identified by MRN, date of birth, ID band Patient awake    Reviewed: Allergy & Precautions, NPO status , Patient's Chart, lab work & pertinent test results  Airway Mallampati: II  TM Distance: >3 FB Neck ROM: Full    Dental  (+) Teeth Intact, Dental Advisory Given   Pulmonary    breath sounds clear to auscultation       Cardiovascular  Rhythm:Regular Rate:Normal     Neuro/Psych    GI/Hepatic   Endo/Other    Renal/GU      Musculoskeletal   Abdominal   Peds  Hematology   Anesthesia Other Findings   Reproductive/Obstetrics                             Anesthesia Physical Anesthesia Plan  ASA: II  Anesthesia Plan: General   Post-op Pain Management:    Induction: Intravenous  PONV Risk Score and Plan: Ondansetron, Dexamethasone and Propofol infusion  Airway Management Planned: Double Lumen EBT  Additional Equipment: Arterial line  Intra-op Plan:   Post-operative Plan: Extubation in OR  Informed Consent: I have reviewed the patients History and Physical, chart, labs and discussed the procedure including the risks, benefits and alternatives for the proposed anesthesia with the patient or authorized representative who has indicated his/her understanding and acceptance.     Dental advisory given  Plan Discussed with: CRNA and Anesthesiologist  Anesthesia Plan Comments:         Anesthesia Quick Evaluation

## 2021-04-18 NOTE — Anesthesia Procedure Notes (Signed)
Arterial Line Insertion Start/End5/03/2021 8:00 AM, 04/18/2021 8:05 AM Performed by: Glynda Jaeger, CRNA, CRNA  Preanesthetic checklist: patient identified, IV checked, site marked, risks and benefits discussed, surgical consent, monitors and equipment checked, pre-op evaluation, timeout performed and anesthesia consent Left, radial was placed Catheter size: 20 G Hand hygiene performed  and maximum sterile barriers used  Allen's test indicative of satisfactory collateral circulation Attempts: 1 Procedure performed without using ultrasound guided technique. Following insertion, dressing applied and Biopatch. Post procedure assessment: normal  Patient tolerated the procedure well with no immediate complications.

## 2021-04-18 NOTE — Brief Op Note (Addendum)
04/18/2021  1:19 PM  PATIENT:  Kathleen Hughes  45 y.o. female  PRE-OPERATIVE DIAGNOSIS:  Right lower lobe lung nodule  POST-OPERATIVE DIAGNOSIS: Non-small cell carcinoma right lower lobe, clinical stage Ia (T1, N0)  PROCEDURE:   Xi Robotic assisted right thoracoscopy Wedge resection right lower lobe lung nodule Right lower lobe basilar segmentectomy Lymph node dissection Intercostal nerve blocks levels 3 through 10  SURGEON:  Surgeon(s) and Role:    * Melrose Nakayama, MD - Primary  PHYSICIAN ASSISTANT:  Nicholes Rough, PA-C  ANESTHESIA:   general  EBL:  150 mL   BLOOD ADMINISTERED:none  DRAINS: ROUTINE   LOCAL MEDICATIONS USED:  BUPIVICAINE   SPECIMEN:  Source of Specimen:  RIGHT LOWER LOBE WEDGE  DISPOSITION OF SPECIMEN:  PATHOLOGY  COUNTS:  YES  DICTATION: .Dragon Dictation  PLAN OF CARE: Admit to inpatient   PATIENT DISPOSITION:  PACU-stable condition     Delay start of Pharmacological VTE agent (>24hrs) due to surgical blood loss or risk of bleeding: no  Findings: Frozen section revealed non-small cell carcinoma.

## 2021-04-18 NOTE — Progress Notes (Addendum)
      [address]       [place] [address]             [phone]      S/p Right lower lobe basilar segementectomy  BP 110/77   Pulse 73   Temp 97.8 F (36.6 C) (Oral)   Resp 15   Ht 5\' 5"  (1.651 m)   Wt 54.2 kg   SpO2 100%   BMI 19.88 kg/m  RA 100% sat  Intake/Output Summary (Last 24 hours) at 04/18/2021 1842 Last data filed at 04/18/2021 1800 Gross per 24 hour  Intake 1595.28 ml  Output 770 ml  Net 825.28 ml   Initial postop CXR showed pneumo with tube on water seal- placed on suction- +air leak Pneumoperitoneum secondary to small tear in diaphragm C/o left eye- ketorolac eye drops ordered  Remo Lipps C. Roxan Hockey, MD [facility] [phone]

## 2021-04-18 NOTE — Interval H&P Note (Signed)
History and Physical Interval Note:  04/18/2021 7:34 AM  Kathleen Hughes  has presented today for surgery, with the diagnosis of right lower lobe lung nodule.  The various methods of treatment have been discussed with the patient and family. After consideration of risks, benefits and other options for treatment, the patient has consented to  Procedure(s): XI ROBOTIC ASSISTED THORASCOPY-WEDGE RESECTION possible lower lobectomy (Right) as a surgical intervention.  The patient's history has been reviewed, patient examined, no change in status, stable for surgery.  I have reviewed the patient's chart and labs.  Questions were answered to the patient's satisfaction.     Melrose Nakayama

## 2021-04-18 NOTE — Transfer of Care (Signed)
Immediate Anesthesia Transfer of Care Note  Patient: Kathleen Hughes  Procedure(s) Performed: XI ROBOTIC ASSISTED THORASCOPY-WEDGE RESECTION WITH BASILAR SEGMENTECTOMY (Right Chest) INTERCOSTAL NERVE BLOCK (Right Chest) NODE DISSECTION (Right Chest)  Patient Location: PACU  Anesthesia Type:General  Level of Consciousness: drowsy  Airway & Oxygen Therapy: Patient Spontanous Breathing and Patient connected to nasal cannula oxygen  Post-op Assessment: Report given to RN and Post -op Vital signs reviewed and stable  Post vital signs: Reviewed and stable  Last Vitals:  Vitals Value Taken Time  BP 111/70 04/18/21 1318  Temp    Pulse 86 04/18/21 1320  Resp 16 04/18/21 1320  SpO2 100 % 04/18/21 1320  Vitals shown include unvalidated device data.  Last Pain:  Vitals:   04/18/21 0702  PainSc: 0-No pain      Patients Stated Pain Goal: 5 (26/33/35 4562)  Complications: No complications documented.

## 2021-04-19 ENCOUNTER — Inpatient Hospital Stay (HOSPITAL_COMMUNITY): Payer: BC Managed Care – PPO

## 2021-04-19 ENCOUNTER — Encounter (HOSPITAL_COMMUNITY): Payer: Self-pay | Admitting: Thoracic Surgery (Cardiothoracic Vascular Surgery)

## 2021-04-19 LAB — CBC
HCT: 27.8 % — ABNORMAL LOW (ref 36.0–46.0)
Hemoglobin: 9.6 g/dL — ABNORMAL LOW (ref 12.0–15.0)
MCH: 33 pg (ref 26.0–34.0)
MCHC: 34.5 g/dL (ref 30.0–36.0)
MCV: 95.5 fL (ref 80.0–100.0)
Platelets: 189 10*3/uL (ref 150–400)
RBC: 2.91 MIL/uL — ABNORMAL LOW (ref 3.87–5.11)
RDW: 11.5 % (ref 11.5–15.5)
WBC: 9.9 10*3/uL (ref 4.0–10.5)
nRBC: 0 % (ref 0.0–0.2)

## 2021-04-19 LAB — BASIC METABOLIC PANEL
Anion gap: 6 (ref 5–15)
BUN: 8 mg/dL (ref 6–20)
CO2: 19 mmol/L — ABNORMAL LOW (ref 22–32)
Calcium: 7.4 mg/dL — ABNORMAL LOW (ref 8.9–10.3)
Chloride: 111 mmol/L (ref 98–111)
Creatinine, Ser: 0.66 mg/dL (ref 0.44–1.00)
GFR, Estimated: >60 mL/min (ref >60)
Glucose, Bld: 134 mg/dL — ABNORMAL HIGH (ref 70–99)
Potassium: 3.7 mmol/L (ref 3.5–5.1)
Sodium: 136 mmol/L (ref 135–145)

## 2021-04-19 MED ORDER — MORPHINE SULFATE (PF) 2 MG/ML IV SOLN
2.0000 mg | INTRAVENOUS | Status: DC | PRN
Start: 2021-04-19 — End: 2021-04-21

## 2021-04-19 MED ORDER — POTASSIUM CHLORIDE CRYS ER 20 MEQ PO TBCR
20.0000 meq | EXTENDED_RELEASE_TABLET | ORAL | Status: AC
  Administered 2021-04-19 (×3): 20 meq via ORAL
  Filled 2021-04-19 (×3): qty 1

## 2021-04-19 NOTE — Hospital Course (Addendum)
HPI:   Kathleen Hughes is a 45 year old woman with a history of recurrent UTIs and basal cell skin cancer.  She was being evaluated for microhematuria.  A CT of the abdomen and pelvis showed a right lower lobe lung nodule back in December.  She had a follow-up scan in March which showed a slight increase in size of the nodule measuring about 11 x 7 mm.   She is a lifelong non-smoker.  She did have Covid but has not had any other respiratory issues.  She denies any chest pain, tightness, pressure, shortness of breath, cough, or wheezing.  Her weight fluctuates a little bit but she has not had any significant weight loss in the past 3 months.  She does have headaches but that is a chronic problem.  Hospital Course:   Kathleen Hughes underwent a robotic-assisted right wedge resection with basilar segmentectomy with Dr. Roxan Hockey. She tolerated the procedure well and was transferred to the ICU in stable condition. POD 1 we continued her chest tube to suction. She had a left eye corneal abrasion which was improving with eye drops. She developed a migraine headache with associated nausea.  She was treated with her home Topimax and Excedrin migraine.  She was also treated with anti-emetics.  Her chest tube was placed to water seal on 04/20/2021.  Follow up CXR remained stable.  She had no air leak and her chest tube was removed on 04/21/2021.  Follow up CXR showed ***.  She is ambulating without difficulty.  Her surgical incisions are healing without evidence of infection.  She is medically stable for discharge home today.

## 2021-04-19 NOTE — Progress Notes (Signed)
1 Day Post-Op Procedure(s) (LRB): XI ROBOTIC ASSISTED THORASCOPY-WEDGE RESECTION WITH BASILAR SEGMENTECTOMY (Right) INTERCOSTAL NERVE BLOCK (Right) NODE DISSECTION (Right) Subjective: Nausea, pain, L eye pain last night Feels better this AM, "stiff/ sore"  Objective: Vital signs in last 24 hours: Temp:  [97 F (36.1 C)-98.5 F (36.9 C)] 98.5 F (36.9 C) (05/05 0300) Pulse Rate:  [73-95] 73 (05/04 1520) Cardiac Rhythm: Normal sinus rhythm (05/05 0300) Resp:  [0-35] 25 (05/05 0700) BP: (90-130)/(59-85) 106/59 (05/05 0700) SpO2:  [85 %-100 %] 98 % (05/05 0500) Arterial Line BP: (114-121)/(57-63) 114/57 (05/04 1335) Weight:  [56.7 kg] 56.7 kg (05/05 0500)  Hemodynamic parameters for last 24 hours:    Intake/Output from previous day: 05/04 0701 - 05/05 0700 In: 2806.8 [I.V.:2156.8; IV Piggyback:449.9] Out: 2060 [Urine:1520; Blood:150; Chest Tube:390] Intake/Output this shift: No intake/output data recorded.  General appearance: alert, cooperative and no distress Neurologic: intact Heart: regular rate and rhythm Lungs: diminished breath sounds right base + air leak  Lab Results: Recent Labs    04/16/21 1342 04/19/21 0114  WBC 5.0 9.9  HGB 12.6 9.6*  HCT 36.3 27.8*  PLT 254 189   BMET:  Recent Labs    04/16/21 1342 04/19/21 0114  NA 136 136  K 3.8 3.7  CL 110 111  CO2 18* 19*  GLUCOSE 110* 134*  BUN 7 8  CREATININE 0.74 0.66  CALCIUM 8.5* 7.4*    PT/INR:  Recent Labs    04/16/21 1342  LABPROT 14.6  INR 1.1   ABG    Component Value Date/Time   PHART 7.395 04/16/2021 1417   HCO3 19.3 (L) 04/16/2021 1417   ACIDBASEDEF 4.7 (H) 04/16/2021 1417   O2SAT 98.5 04/16/2021 1417   CBG (last 3)  Recent Labs    04/18/21 1617  GLUCAP 172*    Assessment/Plan: S/P Procedure(s) (LRB): XI ROBOTIC ASSISTED THORASCOPY-WEDGE RESECTION WITH BASILAR SEGMENTECTOMY (Right) INTERCOSTAL NERVE BLOCK (Right) NODE DISSECTION (Right) -POD # 1 Overall doing well +  air leak with cough- pneumothorax resolved with CT on suction, leave on suction today Pain control better Left eye corneal abrasion- improved with eye drops Anemia secondary to ABL- follow CBG elevated, last night, better this AM, normal A1c - dc CBG, SSI Enoxaparin for DVT prophylaxis Ambulate Transfer to [place]   LOS: 1 day    Melrose Nakayama 04/19/2021

## 2021-04-19 NOTE — Progress Notes (Signed)
ivf stopped in [facility]

## 2021-04-20 ENCOUNTER — Inpatient Hospital Stay (HOSPITAL_COMMUNITY): Payer: BC Managed Care – PPO

## 2021-04-20 LAB — CBC
HCT: 30.2 % — ABNORMAL LOW (ref 36.0–46.0)
Hemoglobin: 10.1 g/dL — ABNORMAL LOW (ref 12.0–15.0)
MCH: 32.4 pg (ref 26.0–34.0)
MCHC: 33.4 g/dL (ref 30.0–36.0)
MCV: 96.8 fL (ref 80.0–100.0)
Platelets: 185 10*3/uL (ref 150–400)
RBC: 3.12 MIL/uL — ABNORMAL LOW (ref 3.87–5.11)
RDW: 12.2 % (ref 11.5–15.5)
WBC: 12.4 10*3/uL — ABNORMAL HIGH (ref 4.0–10.5)
nRBC: 0 % (ref 0.0–0.2)

## 2021-04-20 LAB — COMPREHENSIVE METABOLIC PANEL
ALT: 17 U/L (ref 0–44)
AST: 21 U/L (ref 15–41)
Albumin: 3.2 g/dL — ABNORMAL LOW (ref 3.5–5.0)
Alkaline Phosphatase: 43 U/L (ref 38–126)
Anion gap: 4 — ABNORMAL LOW (ref 5–15)
BUN: 7 mg/dL (ref 6–20)
CO2: 24 mmol/L (ref 22–32)
Calcium: 8.1 mg/dL — ABNORMAL LOW (ref 8.9–10.3)
Chloride: 109 mmol/L (ref 98–111)
Creatinine, Ser: 0.76 mg/dL (ref 0.44–1.00)
GFR, Estimated: >60 mL/min (ref >60)
Glucose, Bld: 91 mg/dL (ref 70–99)
Potassium: 3.8 mmol/L (ref 3.5–5.1)
Sodium: 137 mmol/L (ref 135–145)
Total Bilirubin: 0.5 mg/dL (ref 0.3–1.2)
Total Protein: 5.1 g/dL — ABNORMAL LOW (ref 6.5–8.1)

## 2021-04-20 MED ORDER — ASPIRIN-ACETAMINOPHEN-CAFFEINE 250-250-65 MG PO TABS
1.0000 | ORAL_TABLET | Freq: Four times a day (QID) | ORAL | Status: DC | PRN
  Administered 2021-04-20: 1 via ORAL
  Filled 2021-04-20 (×2): qty 1

## 2021-04-20 NOTE — Progress Notes (Signed)
Complained of migraine head ache  PA made aware with order, excedrin for migraine given. Continue to monitor.

## 2021-04-20 NOTE — Progress Notes (Signed)
      [address]       [place] [address]             [phone]      2 Days Post-Op Procedure(s) (LRB): XI ROBOTIC ASSISTED THORASCOPY-WEDGE RESECTION WITH BASILAR SEGMENTECTOMY (Right) INTERCOSTAL NERVE BLOCK (Right) NODE DISSECTION (Right) Subjective: She doesn't feel well this morning and has a migraine.   Objective: Vital signs in last 24 hours: Temp:  [97.8 F (36.6 C)-98.4 F (36.9 C)] 97.8 F (36.6 C) (05/06 0300) Pulse Rate:  [74-94] 86 (05/06 0300) Cardiac Rhythm: Normal sinus rhythm (05/05 1900) Resp:  [14-22] 17 (05/06 0300) BP: (114-122)/(66-75) 114/66 (05/06 0300) SpO2:  [96 %-100 %] 97 % (05/06 0300)     Intake/Output from previous day: 05/05 0701 - 05/06 0700 In: 406.6 [P.O.:120; I.V.:286.6] Out: 640 [Urine:300; Chest Tube:340] Intake/Output this shift: No intake/output data recorded.  General appearance: alert, cooperative and no distress Heart: regular rate and rhythm, S1, S2 normal, no murmur, click, rub or gallop Lungs: clear to auscultation bilaterally Abdomen: soft, non-tender; bowel sounds normal; no masses,  no organomegaly Extremities: extremities normal, atraumatic, no cyanosis or edema Wound: clean and dry  Lab Results: Recent Labs    04/19/21 0114 04/20/21 0046  WBC 9.9 12.4*  HGB 9.6* 10.1*  HCT 27.8* 30.2*  PLT 189 185   BMET:  Recent Labs    04/19/21 0114 04/20/21 0046  NA 136 137  K 3.7 3.8  CL 111 109  CO2 19* 24  GLUCOSE 134* 91  BUN 8 7  CREATININE 0.66 0.76  CALCIUM 7.4* 8.1*    PT/INR: No results for input(s): LABPROT, INR in the last 72 hours. ABG    Component Value Date/Time   PHART 7.395 04/16/2021 1417   HCO3 19.3 (L) 04/16/2021 1417   ACIDBASEDEF 4.7 (H) 04/16/2021 1417   O2SAT 98.5 04/16/2021 1417   CBG (last 3)  Recent Labs    04/18/21 1617  GLUCAP 172*    Assessment/Plan: S/P Procedure(s) (LRB): XI ROBOTIC ASSISTED THORASCOPY-WEDGE RESECTION WITH BASILAR SEGMENTECTOMY  (Right) INTERCOSTAL NERVE BLOCK (Right) NODE DISSECTION (Right)  1. Tiny right apical pneumo on CXR this morning, stable from yesterday.  2. Pulm- tolerating room air with good oxygen saturation 3. Renal-creatinine 0.76 4. H and H stable 5. Endo-blood glucose well controlled 6. Corneal abrasion-improved with eye drops  Plan: Water seal today, CXR in the morning.  On a normal diet, Excedrin migraine ordered, on home Topamax. Encouraged use of incentive spirometer and ambulation.      LOS: 2 days    Elgie Collard 04/20/2021

## 2021-04-20 NOTE — Progress Notes (Signed)
With persistent feeling of nauseous ness despite zofran iv given. Lemon flavor italian ice given tolerated well. Phenergan ivpb given, asleep after. Continue to monitor.

## 2021-04-20 NOTE — Op Note (Signed)
NAME: WHITTANY, PARISH MEDICAL RECORD NO: [id number] ACCOUNT NO: [id number] DATE OF BIRTH: 1976/10/07 FACILITY: [facility] LOCATION: [facility] PHYSICIAN: Revonda Standard. Roxan Hockey, MD  Operative Report   DATE OF PROCEDURE: 04/19/2021  PREOPERATIVE DIAGNOSIS:  Right lower lobe lung nodule.  POSTOPERATIVE DIAGNOSIS:  Non-small cell carcinoma, right lower lobe, clinical stage IA (T1, N0).  PROCEDURE PERFORMED:   Xi robotic-assisted right thoracoscopy, Wedge resection of right lower lobe lung nodule, Right lower lobe basilar segmentectomy, Lymph node dissection and Intercostal nerve blocks levels 3 through 10.  SURGEON:  Revonda Standard. Roxan Hockey, MD  ASSISTANT:  Nicholes Rough, PA  ANESTHESIA:  General.  FINDINGS:  Nodule in the right lower lobe.  Frozen section showed non-small cell carcinoma.  CLINICAL NOTE: Kathleen Hughes is a 45 year old woman with no history of tobacco abuse.  She was found to have a lung nodule on a CT of the abdomen and pelvis.  On followup, the nodule had increased in size.  It was mildly hypermetabolic on PET CT.  There  were no other suspicious findings.  She was advised to undergo a wedge resection and possible anatomic resection based on intraoperative findings.  The indications, risks, benefits, and alternatives were discussed in detail with the patient.  She  understood and accepted the risks and agreed to proceed.  OPERATIVE NOTE: Mrs. Pledger was brought to the operating room on 04/19/2021.  Anesthesia established intravenous access and placed an arterial blood pressure monitoring line in the preoperative holding area. In the operating room, she was anesthetized  and intubated with a double lumen endotracheal tube.  Intravenous antibiotics were administered.  A Foley catheter was placed.  Sequential compression devices were placed on the calves for DVT prophylaxis.  She was placed in a left lateral decubitus  position and the right chest was prepped and draped in the  usual sterile fashion.  A Bair Hugger was in placed for active warming.  A timeout was performed.  A solution containing 20 mL of liposomal bupivacaine, 30 mL of 0.5% bupivacaine and 50 mL of saline was prepared.  This was used for local anesthesia at the incision sites as well as for the intercostal nerve blocks.  An incision was made in  the eighth interspace in the mid axillary line.  An 8 mm port was inserted.  The scope was advanced.  The initial insertion was a transdiaphragmatic.  The port was removed and was replaced and then was intrapleural.  Carbon dioxide was insufflated per  protocol.  Inspection with the scope revealed normal-appearing visceral and parietal pleura and no pleural effusion.  A 12 mm port was placed in the eighth interspace anterior to the camera port.  Intercostal nerve blocks then were performed from the  third to the tenth interspace.  A needle was inserted from a posterior approach and 10 mL of the bupivacaine solution was injected into a subpleural plane at each level.  Two additional eighth interspace ports were placed, a 12 mm port 5 cm posterior to the camera port  and then an 8 mm one 5 cm posterior to that.  Of note, the patient was very thin and the spacing between the ports was relatively close.  A 12 mm AirSeal port was placed in the tenth interspace posterolaterally.  The inferior ligament was divided.  All lymph nodes that were encountered during the dissection were removed and sent as separate specimens for permanent pathology.  There were some mildly enlarged, but otherwise benign-appearing lymph nodes in the  inferior  ligament and the paraesophageal areas.  Pleural reflection was divided at the hilum posteriorly and the subcarinal nodes were removed as well.  By this point, the lung was sufficiently deflated to inspect for the nodule.  The nodule was  identified in the basilar portion of the left lower lobe.  A wedge resection was performed using the robotic  stapler.  Gross margins were clear, but relatively close on one side due to the proximity to the inferior pulmonary vein.  The specimen was  placed into an endoscopic retrieval bag, removed, and sent for frozen section.  We then awaited the results of the frozen section, which returned showing a non-small cell carcinoma.  This did not have the appearance of a carcinoid tumor per pathology.  The decision was made  to proceed with basilar segmentectomy to ensure adequate resection margins.  The pleural reflection was divided over the mediastinum superior to the azygos vein and the area was explored, but no lymph nodes were immediately apparent.  Dissection was  carried out in the fissure.  The fissure was nearly complete.  The pleura was incised over the pulmonary artery at the confluence of the fissures and then the dissection was carried down along the basilar segmental pulmonary artery.  There were 2 superior  segmental branches that were in close proximity.  These branches were identified and preserved.  The basilar segmental trunk gave off two branches that came off at relatively acute angles anteriorly and medially.  The anterior and medial branches were dissected  out, encircled and divided separately with the vascular cartridge on the robotic stapler.  Then the remainder of the basilar segmental trunk was dissected out and divided as well.  The superior segmental pulmonary vein was identified.  The basilar  segmental veins were encircled and divided with a vascular stapler.  The superior segmental bronchus was identified.  A node was dissected out at the bifurcation of the superior segmental bronchus.  The basilar segmental bronchi were encircled.  A  stapler was placed across the bronchus at its origin.  A test inflation showed good aeration of the upper, middle lobes and the superior segment.  Stapler was fired transecting the basilar segmental bronchus.  ICG was administered intravenously and there   was good perfusion of the superior segment.  This along with the test inflation showed a clear demarcation between the basilar segments and the superior segment.  Segmentectomy was completed with sequential firings of the robotic stapler.  The specimen  was placed into an endoscopic retrieval bag and brought down to the inferior aspect of the chest.  The robot was undocked and the anterior eighth interspace port was removed.  This incision was lengthened slightly and the specimen was removed through  it and sent for permanent pathology.  The chest was copiously irrigated with warm saline.  A test inflation to 30 cm of water revealed some leakage from the parenchyma along the fissure as well as around the superior segmental artery dissection plane.   Progel was applied to these areas.  The vessel loop and sponges that had been placed during the procedure were removed.  A 28-French Blake drain was placed through the original port incision and directed to the apex.  It was secured with a #1 silk  suture.  Dual lung ventilation was resumed.  The remainder of the ports were removed.  The incisions were closed in standard fashion.  Dermabond was applied.  The chest tube was placed to a Pleur-evac on  waterseal.  The patient then was extubated in the  operating room and taken to the postanesthetic care unit in good condition.  All sponge, needle and instrument counts were correct at the end of the procedure.  NIK D: 04/19/2021 1:07:22 pm T: 04/20/2021 3:00:00 am  JOB: [id number]/ [id number]

## 2021-04-20 NOTE — Discharge Summary (Addendum)
[address]       [place] [address]             [phone]      Physician Discharge Summary  Patient ID: Kathleen Hughes MRN: [id number] DOB/AGE: 09-01-1976 45 y.o.  Admit date: 04/18/2021 Discharge date: 04/22/2021  Admission Diagnoses:  Right lower lobe lung nodule  Patient Active Problem List   Diagnosis Date Noted  . Urinary tract infection with hematuria 09/26/2020  . Encounter for gynecological examination with Papanicolaou smear of cervix 09/26/2020  . Encounter for screening fecal occult blood testing 09/26/2020  . Encounter for well woman exam with routine gynecological exam 09/07/2018  . Vaginal discharge 09/07/2018  . BV (bacterial vaginosis) 09/07/2018  . Screening for colorectal cancer 09/07/2018  . Encounter for surveillance of contraceptive pills 09/07/2018  . Vaginal odor 09/07/2018  . 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 UTI (lower urinary tract infection) 02/15/2015  . Breast mass, right 08/24/2013    Discharge Diagnoses:  Adenocarcinoma right lower lobe-clinical stage Ia, pathologic stage IIIa (T1, N2) Active Problems:   S/P robot-assisted surgical procedure   Discharged Condition: good  HPI:   Kathleen Hughes is a 45 year old woman with a history of recurrent UTIs and basal cell skin cancer.  She was being evaluated for microhematuria.  A CT of the abdomen and pelvis showed a right lower lobe lung nodule back in December.  She had a follow-up scan in March which showed a slight increase in size of the nodule measuring about 11 x 7 mm.  She is a lifelong non-smoker.  She did have Covid but has not had any other respiratory issues.  She denies any chest pain, tightness, pressure, shortness of breath, cough, or wheezing.  Her weight fluctuates a little bit but she has not had any significant weight loss in the past 3 months.  She does have headaches but that is a chronic problem.  Hospital Course:   On 5/6 Kathleen Hughes  underwent a robotic assisted right lower lobe wedge resection with right lower lobe segmentectomy and lymph node dissection with Dr. Roxan Hockey.  She tolerated the procedure well and was transferred to the ICU for continued care.  Postop day 1 she had positive air leak with a resolved pneumothorax on chest x-ray.  We left her to suction.  We continue Lovenox for DVT prophylaxis.  She had a left eye corneal abrasion which improved with drops.  We transfer the patient to see for continued care.  Postop day 2 her air leak was very small therefore we transitioned her to waterseal.  She did have a migraine even on her prophylactic medication, therefore we ordered Excedrin for some relief.  We continue to encourage ambulation in the halls and for her to use her incentive spirometer.  She had her chest tube removed on 04/21/2021.  Follow-up chest x-ray showed increase in right pneumothorax.  She was observed overnight and repeat CXR remained stable.  She is ambulating independently.  Her surgical incisions are healing without evidence of infection.  She is medically stable for discharge home today.  Significant Diagnostic Studies:   CLINICAL DATA:  Chest tube present.  Recent pneumothorax  EXAM: PORTABLE CHEST 1 VIEW  COMPARISON:  Apr 19, 2021.  FINDINGS: Stable chest tube positioning on the right with small apical pneumothorax on the right. No tension component. There is a small right pleural effusion. There is postoperative change in the right lung base medially. Ill-defined  opacity in the medial right base may represent postoperative hemorrhage. Lungs elsewhere clear. Heart size and pulmonary vascularity normal. No adenopathy. Pneumoperitoneum again noted. Status post mastectomy on the right.  IMPRESSION: Postoperative changes on the right. Suspect hemorrhage in the medial right base at the site of recent surgery. Small right apical pneumothorax with chest tube in place. No tension component.  Small right pleural effusion. Left lung clear. Stable cardiac silhouette. Pneumoperitoneum again noted.   Electronically Signed   By: Lowella Grip III M.D.   On: 04/20/2021 09:04   Treatments:   NAME: ANEVAY, CAMPANELLA MEDICAL RECORD NO: [id number] ACCOUNT NO: [id number] DATE OF BIRTH: 03-Sep-1976 FACILITY[facility]ATION[facility] PHYSICIAN: Revonda Standard. Roxan Hockey, MD  Operative Report   DATE OF PROCEDURE: 04/19/2021  PREOPERATIVE DIAGNOSIS:  Right lower lobe lung nodule.  POSTOPERATIVE DIAGNOSIS:  Non-small cell carcinoma, right lower lobe, clinical stage IA (T1, N0).  PROCEDURE PERFORMED:  Xi robotic-assisted right thoracoscopy, wedge resection of right lower lobe lung nodule, right lower lobe basilar segmentectomy, lymph node dissection and intercostal nerve blocks levels 3 through 10.  SURGEON:  Revonda Standard. Roxan Hockey, MD  ASSISTANT:  Nicholes Rough, PA  ANESTHESIA:  General.  FINDINGS:  A nodule in the right lower lobe.  Frozen section showed non-small cell carcinoma.  Pathology: pending  Discharge Exam: Blood pressure 118/85, pulse 78, temperature 98.3 F (36.8 C), temperature source Oral, resp. rate 20, height 5\' 5"  (1.651 m), weight 56.7 kg, SpO2 100 %.   General appearance: alert, cooperative and no distress Heart: regular rate and rhythm Lungs: clear to auscultation bilaterally Abdomen: soft, non-tender; bowel sounds normal; no masses,  no organomegaly Extremities: extremities normal, atraumatic, no cyanosis or edema Wound: clean and dry   Allergies as of 04/22/2021   No Known Allergies     Medication List    TAKE these medications   acetaminophen 500 MG tablet Commonly known as: TYLENOL Take 2 tablets (1,000 mg total) by mouth every 6 (six) hours as needed.   norethindrone 0.35 MG tablet Commonly known as: Norlyda TAKE 1 TABLET BY MOUTH DAILY. GENERIC EQUIVALENT TO NORA-BE What changed:   how much to take  how to take this  when  to take this   ondansetron 4 MG tablet Commonly known as: Zofran Take 1 tablet (4 mg total) by mouth every 8 (eight) hours as needed for nausea or vomiting.   topiramate 25 MG tablet Commonly known as: TOPAMAX Take 25 mg by mouth daily.       Follow-up Information    [facility]. Call in 1 day(s).   Contact information: [place] [address] [phone]        Melrose Nakayama, MD Follow up.   Specialty: Cardiothoracic Surgery Why: Your follow-up appointment is on 05/01/2021 at 9:15am. Please arrive at 8:45am for a chest xray located at [facility] which is located on the first floor of our building Contact information: [place] [address] [phone]               Signed:  Ellwood Handler, PA-C 04/22/2021, 12:00 PM

## 2021-04-21 ENCOUNTER — Inpatient Hospital Stay (HOSPITAL_COMMUNITY): Payer: BC Managed Care – PPO

## 2021-04-21 MED ORDER — ACETAMINOPHEN 500 MG PO TABS: 1000.0000 mg | ORAL_TABLET | Freq: Four times a day (QID) | ORAL | 0 refills | Status: DC | PRN

## 2021-04-21 MED ORDER — TRAMADOL HCL 50 MG PO TABS: 50.0000 mg | ORAL_TABLET | Freq: Four times a day (QID) | ORAL | 0 refills | Status: DC | PRN

## 2021-04-21 MED ORDER — DIPHENHYDRAMINE HCL 25 MG PO CAPS
25.0000 mg | ORAL_CAPSULE | Freq: Every evening | ORAL | Status: DC | PRN
  Administered 2021-04-21: 25 mg via ORAL
  Filled 2021-04-21: qty 1

## 2021-04-21 MED ORDER — ONDANSETRON HCL 4 MG PO TABS: 4.0000 mg | ORAL_TABLET | Freq: Three times a day (TID) | ORAL | 0 refills | Status: DC | PRN

## 2021-04-21 MED ORDER — MELATONIN 3 MG PO TABS: 3.0000 mg | ORAL_TABLET | Freq: Once | ORAL | Status: DC

## 2021-04-21 NOTE — Progress Notes (Addendum)
Pt is asking something for sleep tonight. MD called, Tab melatonin placed per verbal order from Dr. Roxy Manns but pt later asked RN that she does not want to use Melatonin. She doesnot want to take tylenol or oxy or any pain medicine and she just need something to help her sleep. Will see how it goes later then will reach out to the MD to switch the medicine.  Palma Holter, RN

## 2021-04-21 NOTE — Progress Notes (Addendum)
Pt had one episode of minimal leakage of serosanguineous fluid from her chest tube removal site and reinforced with occlusive dressing. Vitals stable, denies pain. Will continue to monitor  Palma Holter, RN

## 2021-04-21 NOTE — Progress Notes (Addendum)
      [address]       [place] [address]             [phone]       3 Days Post-Op Procedure(s) (LRB): XI ROBOTIC ASSISTED THORASCOPY-WEDGE RESECTION WITH BASILAR SEGMENTECTOMY (Right) INTERCOSTAL NERVE BLOCK (Right) NODE DISSECTION (Right)   Subjective:  Patient feeling a little better. Hoping to go home today.  + BM  Objective: Vital signs in last 24 hours: Temp:  [98.1 F (36.7 C)-98.8 F (37.1 C)] 98.4 F (36.9 C) (05/07 0746) Pulse Rate:  [96-100] 96 (05/07 0746) Cardiac Rhythm: Normal sinus rhythm (05/07 0739) Resp:  [18-20] 19 (05/07 0746) BP: (115-147)/(72-91) 147/84 (05/07 0746) SpO2:  [96 %-98 %] 98 % (05/07 0746)  Intake/Output from previous day: 05/06 0701 - 05/07 0700 In: 170 [P.O.:120; IV Piggyback:50] Out: 280 [Chest Tube:280] Intake/Output this shift: Total I/O In: 340 [P.O.:240; Other:100] Out: 330 [Urine:300; Chest Tube:30]  General appearance: alert, cooperative and no distress Heart: regular rate and rhythm Lungs: clear to auscultation bilaterally Abdomen: soft, non-tender; bowel sounds normal; no masses,  no organomegaly Extremities: extremities normal, atraumatic, no cyanosis or edema Wound: clean and dry  Lab Results: Recent Labs    04/19/21 0114 04/20/21 0046  WBC 9.9 12.4*  HGB 9.6* 10.1*  HCT 27.8* 30.2*  PLT 189 185   BMET:  Recent Labs    04/19/21 0114 04/20/21 0046  NA 136 137  K 3.7 3.8  CL 111 109  CO2 19* 24  GLUCOSE 134* 91  BUN 8 7  CREATININE 0.66 0.76  CALCIUM 7.4* 8.1*    PT/INR: No results for input(s): LABPROT, INR in the last 72 hours. ABG    Component Value Date/Time   PHART 7.395 04/16/2021 1417   HCO3 19.3 (L) 04/16/2021 1417   ACIDBASEDEF 4.7 (H) 04/16/2021 1417   O2SAT 98.5 04/16/2021 1417   CBG (last 3)  Recent Labs    04/18/21 1617  GLUCAP 172*    Assessment/Plan: S/P Procedure(s) (LRB): XI ROBOTIC ASSISTED THORASCOPY-WEDGE RESECTION WITH BASILAR SEGMENTECTOMY  (Right) INTERCOSTAL NERVE BLOCK (Right) NODE DISSECTION (Right)  1. CV- NSR 2. Pulm- CT on water seal, no air leak, stable appearance trace apical space, will d/c chest tube 3. Neuro- migraine resolved, Topimax per home use 4. GI- nausea resolved, will provide zofran tab prn at discharge 5. Dispo- patient stable, no air leak present, will d/c chest tube, if no pneumothorax develops will d/c home later today   LOS: 3 days    Ellwood Handler, PA-C 04/21/2021  I have seen and examined the patient and agree with the assessment and plan as outlined.  Rexene Alberts, MD 04/21/2021 10:45 AM

## 2021-04-21 NOTE — Progress Notes (Signed)
      [address]       [place], [address]             [phone]        CXR reviewed, post chest tube removal.  Follow up CXR shows increase in right apical pneumothorax.  She will require overnight observation with repeat CXR in AM.  Ellwood Handler, PA-C

## 2021-04-22 ENCOUNTER — Inpatient Hospital Stay (HOSPITAL_COMMUNITY): Payer: BC Managed Care – PPO

## 2021-04-22 NOTE — Plan of Care (Signed)

## 2021-04-22 NOTE — Discharge Instructions (Signed)
Discharge Instructions:  1. You may shower, please wash incisions daily with soap and water and keep dry.  If you wish to cover wounds with dressing you may do so but please keep clean and change daily.  No tub baths or swimming until incisions have completely healed.  If your incisions become red or develop any drainage please call our office at [phone]  2. No Driving until cleared by our office and you are no longer using narcotic pain medications  3. Fever of 101.5 for at least 24 hours with no source, please contact our office at [phone]  5. Activity- up as tolerated, please walk at least 3 times per day.  Avoid strenuous activity, no lifting, pushing, or pulling with your arms over 8-10 lbs for a minimum of 6 weeks    Pneumothorax A pneumothorax is commonly called a collapsed lung. It is a condition in which air leaks from a lung and builds up between the thin layer of tissue that covers the lungs (visceral pleura) and the interior wall of the chest cavity (parietal pleura). The air gets trapped outside the lung, between the lung and the chest wall (pleural space). The air takes up space and prevents the lung from fully expanding. This condition sometimes occurs suddenly with no apparent cause. The buildup of air may be small or large. A small pneumothorax may go away on its own. A large pneumothorax will require treatment and hospitalization. What are the causes? This condition may be caused by:  Trauma and injury to the chest wall.  Surgery and other medical procedures.  A complication of an underlying lung problem, especially chronic obstructive pulmonary disease (COPD) or emphysema. Sometimes the cause of this condition is not known. What increases the risk? You are more likely to develop this condition if:  You have an underlying lung problem.  You smoke.  You are 49-56 years old, female, tall, and underweight.  You have a personal or family history of  pneumothorax.  You have an eating disorder (anorexia nervosa). This condition can also happen quickly, even in people with no history of lung problems. What are the signs or symptoms? Sometimes a pneumothorax will have no symptoms. When symptoms are present, they can include:  Chest pain.  Shortness of breath.  Increased rate of breathing.  Bluish color to your lips or skin (cyanosis). How is this diagnosed? This condition may be diagnosed by:  A medical history and physical exam.  A chest X-ray, chest CT scan, or ultrasound. How is this treated? Treatment depends on how severe your condition is. The goal of treatment is to remove the extra air and allow your lung to expand back to its normal size.  For a small pneumothorax: ? No treatment may be needed. ? Extra oxygen is sometimes used to make it go away more quickly.  For a large pneumothorax or one that is causing symptoms, a procedure is done to release the air from around your lungs. To do this, a health care provider may use: ? A needle with a syringe. This is used to suck air from a pleural space where no additional leakage is taking place. ? A chest tube. This is used to suck air where there is ongoing leakage into the pleural space. The chest tube may need to remain in place for several days until the air leak has healed.  In more severe cases, surgery may be needed to repair the damage that is causing the leak.  If  you have multiple pneumothorax episodes or have an air leak that will not heal, a procedure called a pleurodesis may be done. A medicine is placed in the pleural space to irritate the tissues around the lung so that the lung will stick to the chest wall, seal any leaks, and stop any buildup of air in that space. If you have an underlying lung problem, severe symptoms, or a large pneumothorax you will usually need to stay in the hospital.   Follow these instructions at home: Lifestyle  Do not use any products  that contain nicotine or tobacco, such as cigarettes and e-cigarettes. These are major risk factors in pneumothorax. If you need help quitting, ask your health care provider.  Do not lift anything that is heavier than 10 lb (4.5 kg), or the limit that your health care provider tells you, until he or she says that it is safe.  Avoid activities that take a lot of effort (strenuous) for as long as told by your health care provider.  Return to your normal activities as told by your health care provider. Ask your health care provider what activities are safe for you.  Do not fly in an airplane or scuba dive until your health care provider says it is okay. General instructions  Take over-the-counter and prescription medicines only as told by your health care provider.  If a cough or pain makes it difficult for you to sleep at night, try sleeping in a semi-upright position in a recliner or by using 2 or 3 pillows.  If you had a chest tube and it was removed, ask your health care provider when you can remove the bandage (dressing). While the dressing is in place, do not allow it to get wet.  Keep all follow-up visits as told by your health care provider. This is important. Contact a health care provider if:  You cough up thick mucus (sputum) that is yellow or green in color.  You were treated with a chest tube, and you have redness, increasing pain, or discharge at the site where it was placed. Get help right away if:  You have increasing chest pain or shortness of breath.  You have a cough that will not go away.  You begin coughing up blood.  You have pain that is getting worse or is not controlled with medicines.  The site where your chest tube was located opens up.  You feel air coming out of the site where the chest tube was placed.  You have a fever or persistent symptoms for more than 2-3 days. These symptoms may represent a serious problem that is an emergency. Do not wait to see  if the symptoms will go away. Get medical help right away. Call your local emergency services (911 in the [place] Do not drive yourself to the hospital. Summary  A pneumothorax, commonly called a collapsed lung, is a condition in which air leaks from a lung and gets trapped between the lung and the chest wall (pleural space).  The buildup of air may be small or large. A small pneumothorax may go away on its own. A large pneumothorax will require treatment and hospitalization.  Treatment for this condition depends on how severe the pneumothorax is. The goal of treatment is to remove the extra air and allow the lung to expand back to its normal size. This information is not intended to replace advice given to you by your health care provider. Make sure you discuss any questions  you have with your health care provider. Document Revised: 08/03/2020 Document Reviewed: 08/03/2020 Elsevier Patient Education  2021 [employer]. If any questions or concerns arise, please do not hesitate to contact our office at [facility]  Robot-assisted thoracic surgery is a procedure in which robotic arms and surgical instruments are used to perform complex procedures through small incisions. During surgery, the surgeon sits at a console inside of the operating room and uses controls at the console to move the robotic arms. Surgical instruments are attached to the ends of the robotic arms. Instruments include a tool with a light and camera on the end of it (thoracoscope). The camera sends images to a video monitor that your surgeon will use to view the inside of your thoracic area. The thoracic area is between the neck and abdomen. Robot-assisted thoracic surgery may be done to:  Remove a tissue sample to be tested in a lab (biopsy).  Remove a part of a lung (lobectomy) or the whole lung (pneumonectomy).  Remove tumors.  Treat other conditions that affect: ? Your  esophagus. This is the part of your body that carries food and liquid from your mouth to your stomach. ? Your diaphragm. This is a muscle wall between your lungs and stomach area. It helps with breathing.  Remove a portion of the nerves (sympathectomy) that cause excessive sweating. Tell a health care provider about:  Any allergies you have. This is especially important if you have allergies to medicines or sedatives.  All medicines you are taking, including vitamins, herbs, eye drops, creams, and over-the-counter medicines.  Any problems you or family members have had with anesthetic medicines.  Any blood disorders you have.  Any surgeries you have had.  Any medical conditions you have.  Whether you are pregnant or may be pregnant. What are the risks? Generally, this is a safe procedure. However, problems may occur, including:  Infection, such as pneumonia.  Severe bleeding (hemorrhage).  Allergic reactions to medicines.  Damage to organs or structures such as nerves or blood vessels. What happens before the procedure? Staying hydrated Follow instructions from your health care provider about hydration, which may include:  Up to 2 hours before the procedure - you may continue to drink clear liquids, such as water, clear fruit juice, black coffee, and plain tea. Eating and drinking restrictions Follow instructions from your health care provider about eating and drinking, which may include:  8 hours before the procedure - stop eating heavy meals or foods, such as meat, fried foods, or fatty foods.  6 hours before the procedure - stop eating light meals or foods, such as toast or cereal.  6 hours before the procedure - stop drinking milk or drinks that contain milk.  2 hours before the procedure - stop drinking clear liquids. Medicines  Ask your health care provider about: ? Changing or stopping your regular medicines. This is especially important if you are taking  diabetes medicines or blood thinners. ? Taking medicines such as aspirin and ibuprofen. These medicines can thin your blood. Do not take these medicines unless your health care provider tells you to take them. ? Taking over-the-counter medicines, vitamins, herbs, and supplements.  Talk with your health care provider about safe and effective ways to manage pain before and after your procedure. Pain management should fit your specific health needs. Tests  You may have tests, such as: ? CT scan. ? Ultrasound. ? Chest X-ray. ?  Electrocardiogram (ECG).  You may have a blood or urine sample taken. General instructions  Ask your health care provider: ? How your surgery site will be marked. ? What steps will be taken to help prevent infection. These steps may include:  Removing hair at the surgery site.  Washing skin with a germ-killing soap.  Receiving antibiotic medicine.  Do not use any products that contain nicotine or tobacco for at least 4 weeks before the procedure. These products include cigarettes, chewing tobacco, and vaping devices, such as e-cigarettes. If you need help quitting, ask your health care provider.  Plan to have a responsible adult take you home from the hospital or clinic.  Plan to have a responsible adult care for you for the time you are told after you leave the hospital or clinic. This is important. What happens during the procedure?  An IV will be inserted into one of your veins.  You will be given one or more of the following: ? A medicine to help you relax (sedative). ? A medicine to make you fall asleep (general anesthetic). ? A medicine that is injected into an area of your body to numb everything below the injection site (regional anesthetic).  One to four small incisions will be made. The number of incisions and the area where incisions are made will depend on the purpose of your procedure.  A surgical assistant will then place instruments that are  attached to the robotic arms into your thoracic cavity through these small incisions. The robotic arms are equipped with different surgical instruments and a high-definition 3D camera.  The surgeon will sit at a control console and see a magnified, high-resolution 3D image of the surgical field on a monitor. The surgeon will use master controls from the console that respond to the surgeon's movements in real time.  The computer-assisted robot will translate the surgeon's hand, wrist, or finger movements into precise movements of the four robotic arms so that the necessary procedures can be performed.  A drainage tube may be placed to drain excess fluid from the chest cavity.  Your incisions will be closed with stitches (sutures) or staples and covered with a bandage (dressing). The procedure may vary among health care providers and hospitals. What happens after the procedure?  Your blood pressure, heart rate, breathing rate, and blood oxygen level will be monitored until you leave the hospital or clinic.  You may have a drainage tube in place. It may stay in place for a few days after the procedure to monitor for signs of air or fluid buildup in the chest cavity. Summary  Robot-assisted thoracic surgery is a procedure in which robotic arms and surgical instruments are used to help perform complex procedures through small incisions. During surgery, the surgeon sits at a console in the operating room and uses controls at the console to move the robotic arms.  A drainage tube may be placed to drain excess fluid from the chest cavity. The tube will be closely monitored for fluid or air buildup in the chest cavity. This information is not intended to replace advice given to you by your health care provider. Make sure you discuss any questions you have with your health care provider. Document Revised: 08/28/2020 Document Reviewed: 08/28/2020 Elsevier Patient Education  2021 [employer].

## 2021-04-22 NOTE — Progress Notes (Signed)
Pt got discharged to home, discharge instructions provided and patient showed understanding to it, IV taken out,Telemonitor DC,pt left unit in wheelchair with all of the belongings accompanied with a family member (Husband) Najee Cowens,RN  

## 2021-04-22 NOTE — Progress Notes (Signed)
      [address]       [place] [address]             [phone]      4 Days Post-Op Procedure(s) (LRB): XI ROBOTIC ASSISTED THORASCOPY-WEDGE RESECTION WITH BASILAR SEGMENTECTOMY (Right) INTERCOSTAL NERVE BLOCK (Right) NODE DISSECTION (Right)   Subjective:  Patient didn't sleep well.  States she needs to go home.  She states she cant rest here and is going out of her mind.  Objective: Vital signs in last 24 hours: Temp:  [97.9 F (36.6 C)-98.6 F (37 C)] 98.3 F (36.8 C) (05/08 0723) Pulse Rate:  [72-94] 76 (05/08 0723) Cardiac Rhythm: Normal sinus rhythm (05/08 0715) Resp:  [18-21] 20 (05/08 0723) BP: (113-149)/(66-98) 113/81 (05/08 0723) SpO2:  [98 %-100 %] 100 % (05/08 0723)  Intake/Output from previous day: 05/07 0701 - 05/08 0700 In: 55 [P.O.:720] Out: 930 [Urine:900; Chest Tube:30] Intake/Output this shift: Total I/O In: 240 [P.O.:240] Out: 300 [Urine:300]  General appearance: alert, cooperative and no distress Heart: regular rate and rhythm Lungs: clear to auscultation bilaterally Abdomen: soft, non-tender; bowel sounds normal; no masses,  no organomegaly Wound: clean and dry  Lab Results: Recent Labs    04/20/21 0046  WBC 12.4*  HGB 10.1*  HCT 30.2*  PLT 185   BMET:  Recent Labs    04/20/21 0046  NA 137  K 3.8  CL 109  CO2 24  GLUCOSE 91  BUN 7  CREATININE 0.76  CALCIUM 8.1*    PT/INR: No results for input(s): LABPROT, INR in the last 72 hours. ABG    Component Value Date/Time   PHART 7.395 04/16/2021 1417   HCO3 19.3 (L) 04/16/2021 1417   ACIDBASEDEF 4.7 (H) 04/16/2021 1417   O2SAT 98.5 04/16/2021 1417   CBG (last 3)  No results for input(s): GLUCAP in the last 72 hours.  Assessment/Plan: S/P Procedure(s) (LRB): XI ROBOTIC ASSISTED THORASCOPY-WEDGE RESECTION WITH BASILAR SEGMENTECTOMY (Right) INTERCOSTAL NERVE BLOCK (Right) NODE DISSECTION (Right)  1. CV- remains in NSR 2. Pulm- CT out yesterday, CXR  showed pneumothorax, repeat CXR this morning shows CXR is stable in size with now layering pleural effusion on the right.. patient is asymptomatic 3. GI- nausea resolved, would like zofran for discharge 4. Dispo- patient stable, unfortunately she developed pneumothorax post chest tube removal, I spoke with nurse yesterday who stated she could hear air coming from chest tube site and she had to reinforce the dressing... patient is asymptomatic and wants to go home.  I suspect she will need to stay and undergo repeat CXR In AM, however will discuss possible discharge with Dr. Roxy Manns   LOS: 4 days    Ellwood Handler, PA-C 04/22/2021

## 2021-04-23 ENCOUNTER — Other Ambulatory Visit: Payer: Self-pay | Admitting: Thoracic Surgery (Cardiothoracic Vascular Surgery)

## 2021-04-23 DIAGNOSIS — Z9889 Other specified postprocedural states: Secondary | ICD-10-CM

## 2021-04-24 ENCOUNTER — Ambulatory Visit
Admission: RE | Admit: 2021-04-24 | Discharge: 2021-04-24 | Disposition: A | Discharge status: Home / Self Care | Payer: BC Managed Care – PPO | Source: Ambulatory Visit | Attending: Thoracic Surgery (Cardiothoracic Vascular Surgery) | Admitting: Thoracic Surgery (Cardiothoracic Vascular Surgery)

## 2021-04-24 ENCOUNTER — Ambulatory Visit (INDEPENDENT_AMBULATORY_CARE_PROVIDER_SITE_OTHER): Payer: Self-pay | Admitting: Physician Assistant

## 2021-04-24 ENCOUNTER — Other Ambulatory Visit: Payer: Self-pay

## 2021-04-24 VITALS — BP 128/80 | HR 79 | Temp 98.4°F | Resp 20 | Ht 65.0 in | Wt 116.6 lb

## 2021-04-24 DIAGNOSIS — Z9889 Other specified postprocedural states: Secondary | ICD-10-CM

## 2021-04-24 DIAGNOSIS — J948 Other specified pleural conditions: Secondary | ICD-10-CM | POA: Diagnosis not present

## 2021-04-24 DIAGNOSIS — J939 Pneumothorax, unspecified: Secondary | ICD-10-CM | POA: Diagnosis not present

## 2021-04-24 LAB — SURGICAL PATHOLOGY

## 2021-04-24 MED ORDER — GABAPENTIN 300 MG PO CAPS: 300.0000 mg | ORAL_CAPSULE | ORAL | 1 refills | Status: DC

## 2021-04-24 MED ORDER — LIDOCAINE 5 % EX PTCH: 1.0000 | MEDICATED_PATCH | CUTANEOUS | 0 refills | Status: DC

## 2021-04-24 NOTE — Progress Notes (Signed)
HPI:  Patient returns for hospital follow up.  She developed a pneumothorax post chest tube removal.  This was observed overnight and remained clinically stable.  She presents today for repeat CXR to monitor her right pneumothorax.  She also questions her pathology results.  Since discharge the patient has been experiencing more pain.  She has taken a tramadol and has been nauseated ever since.  She did try a heating pad which provided some relief.  Current Outpatient Medications  Medication Sig Dispense Refill  . acetaminophen (TYLENOL) 500 MG tablet Take 2 tablets (1,000 mg total) by mouth every 6 (six) hours as needed. 30 tablet 0  . norethindrone (NORLYDA) 0.35 MG tablet TAKE 1 TABLET BY MOUTH DAILY. GENERIC EQUIVALENT TO NORA-BE (Patient taking differently: Take 1 tablet by mouth daily. TAKE 1 TABLET BY MOUTH DAILY. GENERIC EQUIVALENT TO NORA-BE) 84 tablet 4  . ondansetron (ZOFRAN) 4 MG tablet Take 1 tablet (4 mg total) by mouth every 8 (eight) hours as needed for nausea or vomiting. 20 tablet 0  . topiramate (TOPAMAX) 25 MG tablet Take 25 mg by mouth daily.     No current facility-administered medications for this visit.    Physical Exam:  BP 128/80 (BP Location: Left Arm, Patient Position: Sitting, Cuff Size: Normal)   Pulse 79   Temp 98.4 F (36.9 C) (Skin)   Resp 20   Ht 5\' 5"  (1.651 m)   Wt 116 lb 9.6 oz (52.9 kg)   SpO2 100% Comment: RA  BMI 19.40 kg/m   Gen: no apparent Heart: RRR Lungs: diminished breath sounds on the left Incisions: C/D/I  Diagnostic Tests:  CXR: improvement of right apical pneumothorax, increase in right pleural effusion  Pathology:  SURGICAL PATHOLOGY  CASE: [id number]  PATIENT: Danille Hovater  Surgical Pathology Report   Clinical History: right lower lobe lung nodule (cm)    FINAL MICROSCOPIC DIAGNOSIS:   A. LUNG, RIGHT LOWER LOBE, WEDGE RESECTION:  - Invasive adenocarcinoma, well differentiated, 1.1 cm  - See oncology table  and comment below   B. LYMPH NODE, LEVEL 9, EXCISION:  - No carcinoma identified in one lymph node (0/1)   C. LYMPH NODE, LEVEL 8, EXCISION:  - No carcinoma identified in one lymph node (0/1)   D. LYMPH NODE, LEVEL 8 #2, EXCISION:  - Metastatic carcinoma involving nodal tissue (1/1)  - See comment   E. LYMPH NODE, LEVEL 7, EXCISION:  - No carcinoma identified in one lymph node (0/1)   F. LYMPH NODE, LEVEL 11, EXCISION:  - No carcinoma identified in one lymph node (0/1)   G. LYMPH NODE, LEVEL 11 #2, EXCISION:  - No carcinoma identified in one lymph node (0/1)   H. LYMPH NODE, LEVEL 12, EXCISION:  - No carcinoma identified in one lymph node (0/1)   I. LYMPH NODE,LEVEL 13, EXCISION:  - No carcinoma identified in one lymph node (0/1)   J. LYMPH NODE, LEVEL 13 #2, EXCISION:  - No carcinoma identified in one lymph node (0/1)   K. LUNG, BASILAR SEGMENTS OF RIGHT LOWER LOBE, LOBECTOMY:  - No residual adenocarcinoma identified     A/P:  1. Improvement of pneumothorax on the right with increase pleural effusion 2. Pain- patient gets sick on her stomach with most narcotics, will try some Lidocaine patches and Gabapentin per Dr. Leslee Home recommendations 3. Pathology- reviewed with patient via Dr. Roxan Hockey, will refer to Dr. Earlie Server 4. RTC as scheduled with Dr. Roxan Hockey next week with CXR  [place]keley Vanaken,  PA-C [facility] [phone]

## 2021-04-25 ENCOUNTER — Telehealth: Payer: Self-pay | Admitting: *Deleted

## 2021-04-25 DIAGNOSIS — Z9889 Other specified postprocedural states: Secondary | ICD-10-CM

## 2021-04-25 NOTE — Telephone Encounter (Signed)
I received referral on Kathleen Hughes yesterday. I called and scheduled her to be seen with Dr. Julien Nordmann tomorrow at [facility].  She verbalized understanding of appt time and place.

## 2021-04-26 ENCOUNTER — Inpatient Hospital Stay: Payer: BC Managed Care – PPO | Attending: Internal Medicine | Admitting: Internal Medicine

## 2021-04-26 ENCOUNTER — Inpatient Hospital Stay: Payer: BC Managed Care – PPO

## 2021-04-26 ENCOUNTER — Other Ambulatory Visit: Payer: Self-pay | Admitting: Thoracic Surgery (Cardiothoracic Vascular Surgery)

## 2021-04-26 ENCOUNTER — Encounter: Payer: Self-pay | Admitting: Internal Medicine

## 2021-04-26 ENCOUNTER — Other Ambulatory Visit: Payer: Self-pay

## 2021-04-26 DIAGNOSIS — Z79899 Other long term (current) drug therapy: Secondary | ICD-10-CM | POA: Diagnosis not present

## 2021-04-26 DIAGNOSIS — Z9889 Other specified postprocedural states: Secondary | ICD-10-CM

## 2021-04-26 DIAGNOSIS — C3490 Malignant neoplasm of unspecified part of unspecified bronchus or lung: Secondary | ICD-10-CM

## 2021-04-26 DIAGNOSIS — C3431 Malignant neoplasm of lower lobe, right bronchus or lung: Secondary | ICD-10-CM | POA: Insufficient documentation

## 2021-04-26 DIAGNOSIS — C3491 Malignant neoplasm of unspecified part of right bronchus or lung: Secondary | ICD-10-CM | POA: Diagnosis not present

## 2021-04-26 DIAGNOSIS — Z5111 Encounter for antineoplastic chemotherapy: Secondary | ICD-10-CM

## 2021-04-26 LAB — CBC WITH DIFFERENTIAL (CANCER CENTER ONLY)
Abs Immature Granulocytes: 0.03 10*3/uL (ref 0.00–0.07)
Basophils Absolute: 0.1 10*3/uL (ref 0.0–0.1)
Basophils Relative: 1 %
Eosinophils Absolute: 0.2 10*3/uL (ref 0.0–0.5)
Eosinophils Relative: 3 %
HCT: 38.8 % (ref 36.0–46.0)
Hemoglobin: 13.1 g/dL (ref 12.0–15.0)
Immature Granulocytes: 0 %
Lymphocytes Relative: 12 %
Lymphs Abs: 1 10*3/uL (ref 0.7–4.0)
MCH: 32.1 pg (ref 26.0–34.0)
MCHC: 33.8 g/dL (ref 30.0–36.0)
MCV: 95.1 fL (ref 80.0–100.0)
Monocytes Absolute: 0.4 10*3/uL (ref 0.1–1.0)
Monocytes Relative: 5 %
Neutro Abs: 6.8 10*3/uL (ref 1.7–7.7)
Neutrophils Relative %: 79 %
Platelet Count: 459 10*3/uL — ABNORMAL HIGH (ref 150–400)
RBC: 4.08 MIL/uL (ref 3.87–5.11)
RDW: 11.9 % (ref 11.5–15.5)
WBC Count: 8.6 10*3/uL (ref 4.0–10.5)
nRBC: 0 % (ref 0.0–0.2)

## 2021-04-26 LAB — BASIC METABOLIC PANEL - CANCER CENTER ONLY
Anion gap: 9 (ref 5–15)
BUN: 9 mg/dL (ref 6–20)
CO2: 25 mmol/L (ref 22–32)
Calcium: 9.3 mg/dL (ref 8.9–10.3)
Chloride: 106 mmol/L (ref 98–111)
Creatinine: 0.82 mg/dL (ref 0.44–1.00)
GFR, Estimated: >60 mL/min (ref >60)
Glucose, Bld: 104 mg/dL — ABNORMAL HIGH (ref 70–99)
Potassium: 4 mmol/L (ref 3.5–5.1)
Sodium: 140 mmol/L (ref 135–145)

## 2021-04-26 NOTE — Progress Notes (Signed)
[place] Telephone:([phone]) [phone]   [phone] Multidisciplinary thoracic oncology clinic CONSULT NOTE  REFERRING PHYSICIAN: Dr. Modesto Charon  REASON FOR CONSULTATION:  45 years old white female recently diagnosed with lung cancer.  HPI Kathleen Hughes is a 45 y.o. female and never smoker with past medical history significant for basal cell carcinoma benign right breast biopsy as well as migraine headache and urinary tract infection.  The patient mentioned that last fall she had symptoms of kidney stone and during her evaluation she had CT of the abdomen which incidentally detected a small right lower lobe lung nodule.  She was advised to have repeat imaging studies in few months for further evaluation of this nodule.  She had repeat CT scan of the chest without contrast on March 05, 2021 which showed spiculated nodule in the medial right lower lobe measuring 1.1 x 0.9 x 1.0 cm. The patient was referred to Dr. Roxan Hockey and a PET scan was performed on 03/23/2021 and that showed low-level hypermetabolism with maximum SUV of 2.5 associated with the solid 0.9 cm medial right lower lobe pulmonary nodule which is unchanged in size since the March 05, 2021 CT chest.  A low-grade primary bronchogenic malignancy could not be excluded.  There was no hypermetabolic thoracic adenopathy or distant metastatic disease.  On 04/18/2021 the patient underwent robotic assisted right thoracoscopy, wedge resection of the right lower lobe lung nodule with right lower lobe basilar segmentectomy and lymph node dissection under the care of Dr. Roxan Hockey.  The final pathology ([id number]) showed the resected right lower lobe wedge resection was consistent with invasive well differentiated adenocarcinoma, acinar predominant (75%) and papillary (25%) measuring 1.1 cm.  Unfortunately there was not evidence of metastatic carcinoma involving nodal tissue from lymph node at level 8.  The other  dissected lymph nodes were negative for malignancy.  It was no evidence for pleural or lymphovascular invasion.  The final pathology was pT1b, pN 2.  Dr. Roxan Hockey kindly referred the patient to me today for evaluation and recommendation regarding treatment of her condition.  She continues to have pain on the right side of the chest from the recent surgery and she has intolerance to the narcotic medication and trying to use ibuprofen as needed.  She also has some nausea with the pain medication and she used Zofran.  She has no current shortness of breath, cough or hemoptysis.  She has no weight loss or night sweats.  She has intermittent nausea with no vomiting, abdominal pain, diarrhea or constipation.  She has no headache or visual changes. Family history, mother is healthy and father has unknown history. The patient is married and has 1 son is 51.  She was accompanied today by her husband [place].  She works in Aeronautical engineer.  She has no history of smoking, alcohol or drug abuse.   HPI  Past Medical History:  Diagnosis Date  . Atypical mole 01/19/2014   mild right breast  . Basal cell carcinoma 01/17/2009   nose bowens  tx-aldara  . Basal cell carcinoma of skin 03/13/2021   nod-left breast ([facility])  . BCC (basal cell carcinoma of skin) 07/08/2013   chest tx-cx3 46f  . Breast mass, right 08/24/2013   1 at 12-1 o'clock and 1 at 3 o'clock [place] [facility] [place]  . Contraceptive management 02/15/2015  . Migraines   . PONV (postoperative nausea and vomiting)   . Recurrent cold sores   . UTI (  lower urinary tract infection) 02/15/2015    Past Surgical History:  Procedure Laterality Date  . BREAST BIOPSY Right   . CESAREAN SECTION    . INTERCOSTAL NERVE BLOCK Right 04/18/2021   Procedure: INTERCOSTAL NERVE BLOCK;  Surgeon: Melrose Nakayama, MD;  Location: [facility];  Service: Thoracic;  Laterality: Right;  . NODE DISSECTION Right 04/18/2021   Procedure: NODE DISSECTION;   Surgeon: Melrose Nakayama, MD;  Location: [place];  Service: Thoracic;  Laterality: Right;    No family history on file.  Social History Social History   Tobacco Use  . Smoking status: Never Smoker  . Smokeless tobacco: Never Used  Vaping Use  . Vaping Use: Never used  Substance Use Topics  . Alcohol use: No  . Drug use: No    No Known Allergies  Current Outpatient Medications  Medication Sig Dispense Refill  . acetaminophen (TYLENOL) 500 MG tablet Take 2 tablets (1,000 mg total) by mouth every 6 (six) hours as needed. 30 tablet 0  . gabapentin (NEURONTIN) 300 MG capsule Take 1 capsule (300 mg total) by mouth as directed. Nightly x 3 days, if pain relieve is achieved take 300 mg by mouth two times daily 60 capsule 1  . lidocaine (LIDODERM) 5 % Place 1 patch onto the skin daily. Remove & Discard patch within 12 hours or as directed by MD 30 patch 0  . norethindrone (NORLYDA) 0.35 MG tablet TAKE 1 TABLET BY MOUTH DAILY. GENERIC EQUIVALENT TO NORA-BE (Patient taking differently: Take 1 tablet by mouth daily. TAKE 1 TABLET BY MOUTH DAILY. GENERIC EQUIVALENT TO NORA-BE) 84 tablet 4  . ondansetron (ZOFRAN) 4 MG tablet Take 1 tablet (4 mg total) by mouth every 8 (eight) hours as needed for nausea or vomiting. 20 tablet 0  . topiramate (TOPAMAX) 25 MG tablet Take 25 mg by mouth daily.     No current facility-administered medications for this visit.    Review of Systems  Constitutional: positive for fatigue Eyes: negative Ears, nose, mouth, throat, and face: negative Respiratory: positive for pleurisy/chest pain Cardiovascular: negative Gastrointestinal: positive for nausea Genitourinary:negative Integument/breast: negative Hematologic/lymphatic: negative Musculoskeletal:negative Neurological: negative Behavioral/Psych: negative Endocrine: negative Allergic/Immunologic: negative  Physical Exam  ACZ:YSAYT, healthy, no distress, well nourished, well developed and  anxious SKIN: skin color, texture, turgor are normal, no rashes or significant lesions HEAD: Normocephalic, No masses, lesions, tenderness or abnormalities EYES: normal, PERRLA, Conjunctiva are pink and non-injected EARS: External ears normal, Canals clear OROPHARYNX:no exudate, no erythema and lips, buccal mucosa, and tongue normal  NECK: supple, no adenopathy, no JVD LYMPH:  no palpable lymphadenopathy, no hepatosplenomegaly BREAST:not examined LUNGS: clear to auscultation , and palpation HEART: regular rate & rhythm, no murmurs and no gallops ABDOMEN:abdomen soft, non-tender, normal bowel sounds and no masses or organomegaly BACK: No CVA tenderness, Range of motion is normal EXTREMITIES:no joint deformities, effusion, or inflammation, no edema  NEURO: alert & oriented x 3 with fluent speech, no focal motor/sensory deficits  PERFORMANCE STATUS: ECOG 1  LABORATORY DATA: Lab Results  Component Value Date   WBC 12.4 (H) 04/20/2021   HGB 10.1 (L) 04/20/2021   HCT 30.2 (L) 04/20/2021   MCV 96.8 04/20/2021   PLT 185 04/20/2021      Chemistry      Component Value Date/Time   NA 137 04/20/2021 0046   NA 138 02/15/2015 0912   K 3.8 04/20/2021 0046   CL 109 04/20/2021 0046   CO2 24 04/20/2021 0046   BUN  7 04/20/2021 0046   BUN 8 02/15/2015 0912   CREATININE 0.76 04/20/2021 0046      Component Value Date/Time   CALCIUM 8.1 (L) 04/20/2021 0046   ALKPHOS 43 04/20/2021 0046   AST 21 04/20/2021 0046   ALT 17 04/20/2021 0046   BILITOT 0.5 04/20/2021 0046   BILITOT 0.5 02/15/2015 0912       RADIOGRAPHIC STUDIES: DG Chest 1 View  Result Date: 04/19/2021 CLINICAL DATA:  Chest tube. EXAM: CHEST  1 VIEW COMPARISON:  04/18/2021. FINDINGS: Right chest tube in stable position. Interim near complete resolution of right pneumothorax with minimal right apical residual. Postsurgical changes medial right lung with associated atelectasis. No pleural effusion. Heart size stable. Known  pneumoperitoneum again noted. IMPRESSION: 1. Right chest tube in stable position. Interim near complete resolution of right pneumothorax with minimal right apical residual. 2. Postsurgical changes medial right lung with associated atelectasis. 3.  Known pneumoperitoneum again noted. Electronically Signed   By: Marcello Moores  Register   On: 04/19/2021 07:00   DG Chest 2 View  Result Date: 04/24/2021 CLINICAL DATA:  Status post right lower lobe wedge resection. EXAM: CHEST - 2 VIEW COMPARISON:  04/22/2021 FINDINGS: Persistent right hydropneumothorax with slight decrease in volume of pleural gas. Left lung remains clear. The cardiopericardial silhouette is within normal limits for size. The visualized bony structures of the thorax show no acute abnormality. IMPRESSION: Persistent right hydropneumothorax with slight decrease in volume of pleural gas. Electronically Signed   By: Misty Stanley M.D.   On: 04/24/2021 14:16   DG Chest 2 View  Result Date: 04/22/2021 CLINICAL DATA:  Follow-up pneumothorax, recent lobectomy EXAM: CHEST - 2 VIEW COMPARISON:  Chest radiograph from one day prior. FINDINGS: Stable cardiomediastinal silhouette with normal heart size. Moderate right ex vacuo hydropneumothorax, overall stable size with new small layering pleural effusion component. No mediastinal shift. No left pneumothorax. Stable trace left pleural vision. Surgical suture lines in the right hilar region. Similar hazy lower right lung opacity compatible with atelectasis. Clear left lung. No pulmonary edema. Stable mild subcutaneous emphysema in the lower right chest wall. IMPRESSION: 1. Moderate right ex vacuo hydropneumothorax, overall stable size with new small layering pleural effusion component. No mediastinal shift. 2. Similar hazy lower right lung opacity compatible with atelectasis. Electronically Signed   By: Ilona Sorrel M.D.   On: 04/22/2021 08:25   DG Chest 2 View  Result Date: 04/21/2021 CLINICAL DATA:  Follow-up  chest tube removal EXAM: CHEST - 2 VIEW COMPARISON:  Film from earlier in the same day. FINDINGS: Previously seen right-sided chest tube has been removed in the interval. There is increase in right-sided pneumothorax with approximately 3.6 cm excursion at the apex as well as a significant hydropneumothorax component in the base. Left lung remains clear. No bony abnormality is seen. Postsurgical changes are again noted on the right. IMPRESSION: Interval chest tube removal with increase in right-sided pneumothorax with a basilar hydropneumothorax component. These results will be called to the ordering clinician or representative by the Radiologist Assistant, and communication documented in the PACS or [employer]. Electronically Signed   By: Inez Catalina M.D.   On: 04/21/2021 15:12   DG Chest 2 View  Result Date: 04/17/2021 CLINICAL DATA:  Follow-up right lower lobe pulmonary nodule. EXAM: CHEST - 2 VIEW COMPARISON:  Chest CT dated 03/08/2021.  PET-CT dated 03/23/2021. FINDINGS: Normal sized heart. Clear lungs. The previously demonstrated right lower lobe lung nodule cannot be visualized. No other lung nodules are  seen. Mild scoliosis. IMPRESSION: 1. The previously demonstrated right lower lobe nodule cannot be visualized radiographically. This would need to be followed with chest CT if not biopsied. 2. Otherwise, unremarkable examination. Electronically Signed   By: Claudie Revering M.D.   On: 04/17/2021 16:53   DG Chest Port 1 View  Result Date: 04/21/2021 CLINICAL DATA:  Status post partial lobectomy EXAM: PORTABLE CHEST 1 VIEW COMPARISON:  Chest radiograph from one day prior. FINDINGS: Right apical chest tube in place. Surgical sutures overlie the medial lower right lung. Stable cardiomediastinal silhouette with normal heart size. Tiny right apical pneumothorax is stable, less than 5%. No left pneumothorax. No pleural effusion. Mild hazy right lung base opacity, stable. Clear left lung. IMPRESSION: 1.  Stable tiny right apical pneumothorax. Right apical chest tube in place. 2. Stable mild hazy right lung base opacity, favor atelectasis. Electronically Signed   By: Ilona Sorrel M.D.   On: 04/21/2021 08:29   DG Chest Port 1 View  Result Date: 04/20/2021 CLINICAL DATA:  Chest tube present.  Recent pneumothorax EXAM: PORTABLE CHEST 1 VIEW COMPARISON:  Apr 19, 2021. FINDINGS: Stable chest tube positioning on the right with small apical pneumothorax on the right. No tension component. There is a small right pleural effusion. There is postoperative change in the right lung base medially. Ill-defined opacity in the medial right base may represent postoperative hemorrhage. Lungs elsewhere clear. Heart size and pulmonary vascularity normal. No adenopathy. Pneumoperitoneum again noted. Status post mastectomy on the right. IMPRESSION: Postoperative changes on the right. Suspect hemorrhage in the medial right base at the site of recent surgery. Small right apical pneumothorax with chest tube in place. No tension component. Small right pleural effusion. Left lung clear. Stable cardiac silhouette. Pneumoperitoneum again noted. Electronically Signed   By: Lowella Grip III M.D.   On: 04/20/2021 09:04   DG Chest Port 1 View  Result Date: 04/18/2021 CLINICAL DATA:  Postoperative evaluation EXAM: PORTABLE CHEST 1 VIEW COMPARISON:  Apr 16, 2021 FINDINGS: Chest tube present with tip in the right apex region. There is a sizable pneumothorax on the right without tension component. There is postoperative change medially on the right. No edema or airspace opacity.  Heart size normal. There is apparent pneumoperitoneum. No bone lesions. Status post right mastectomy. IMPRESSION: 1. Chest tube in place with large pneumothorax. No tension component. 2.  Appearance concerning for a degree of pneumoperitoneum. 3. No edema or airspace opacity. Postoperative change medial right lung. 4.  Status post right mastectomy. Critical  Value/emergent results were called by telephone at the time of interpretation on 04/18/2021 at 3:32 pm to provider [facility] , who verbally acknowledged these results. Electronically Signed   By: Lowella Grip III M.D.   On: 04/18/2021 15:33    ASSESSMENT: This is a very pleasant 45 years old white female recently diagnosed with stage IIIA (T1b, N2, M0) non-small cell lung cancer, well differentiated invasive adenocarcinoma, 75% acinar and 25% papillary presented with right lower lobe lung nodule status post right lower lobe wedge resection with lymph node dissection under the care of Dr. Roxan Hockey on 04/18/2021.   PLAN: I had a lengthy discussion with the patient and her husband today about her current disease stage, prognosis and treatment options. I reviewed all her previous records including the scan and pathology reports. I recommended for the patient to complete the staging work-up by ordering MRI of the brain to rule out any brain metastasis. I will also send her tissue block  to be tested for molecular markers as well as PD-L1 expression. And fortunately the patient has stage IIIa and she would require some form of adjuvant treatment either with adjuvant systemic chemotherapy with 4 cycles of platinum based regimen likely cisplatin and Alimta followed by immunotherapy if she has no EGFR mutation. If the patient has positive EGFR mutation, she could benefit from adjuvant treatment with targeted therapy with osimertinib either as a single agent or after completion of 4 cycles of systemic chemotherapy. I will arrange for the patient to come back for follow-up visit in around 3 weeks for more detailed discussion of her adjuvant treatment options after the availability of the MRI of the brain as well as the molecular studies. For the pain management she will continue her current treatment with Tylenol and ibuprofen as needed.  The patient has intolerance to narcotics. She was advised to call  immediately if she has any other concerning symptoms in the interval.  The patient voices understanding of current disease status and treatment options and is in agreement with the current care plan.  All questions were answered. The patient knows to call the clinic with any problems, questions or concerns. We can certainly see the patient much sooner if necessary.  Thank you so much for allowing me to participate in the care of Takeysha Bonk. I will continue to follow up the patient with you and assist in her care.  The total time spent in the appointment was 90 minutes.  Disclaimer: This note was dictated with voice recognition software. Similar sounding words can inadvertently be transcribed and may not be corrected upon review.   Eilleen Kempf Apr 26, 2021, 2:57 PM

## 2021-04-27 ENCOUNTER — Encounter: Payer: Self-pay | Admitting: *Deleted

## 2021-04-27 DIAGNOSIS — Z5111 Encounter for antineoplastic chemotherapy: Secondary | ICD-10-CM | POA: Insufficient documentation

## 2021-04-27 DIAGNOSIS — C3491 Malignant neoplasm of unspecified part of right bronchus or lung: Secondary | ICD-10-CM | POA: Insufficient documentation

## 2021-04-27 NOTE — Progress Notes (Signed)
I followed up on Kathleen Hughes's insurance auth on MRI brain.  This has not been completed. I reached out to revenue team to help with this.

## 2021-04-28 LAB — BPAM RBC
Blood Product Expiration Date: 202206032359
Blood Product Expiration Date: 202206032359
Unit Type and Rh: 5100
Unit Type and Rh: 5100

## 2021-04-28 LAB — TYPE AND SCREEN
ABO/RH(D): O POS
Antibody Screen: NEGATIVE
Unit division: 0
Unit division: 0

## 2021-04-30 ENCOUNTER — Telehealth: Payer: Self-pay | Admitting: *Deleted

## 2021-04-30 ENCOUNTER — Encounter: Payer: Self-pay | Admitting: *Deleted

## 2021-04-30 NOTE — Progress Notes (Signed)
[facility] testing was requested to pathology dept for molecular and PDL 1 testing on 04/27/21.

## 2021-04-30 NOTE — Progress Notes (Signed)
I followed up on Kathleen Hughes's MRI brain appt. It has not been scheduled nor auth with insurance. I contact HIM to get scan authed.

## 2021-04-30 NOTE — Telephone Encounter (Signed)
I was updated that mri brain was authed with insurance. I called central scheduling and was given a date and time. I called and spoke to Ms. Luevanos to update on appt.  She verbalized understanding of appt.  We did also discuss her disease and she her concerns.  I offered support and encouragement.

## 2021-05-01 ENCOUNTER — Other Ambulatory Visit: Payer: Self-pay

## 2021-05-01 ENCOUNTER — Ambulatory Visit (INDEPENDENT_AMBULATORY_CARE_PROVIDER_SITE_OTHER): Payer: Self-pay | Admitting: Thoracic Surgery (Cardiothoracic Vascular Surgery)

## 2021-05-01 ENCOUNTER — Ambulatory Visit
Admission: RE | Admit: 2021-05-01 | Discharge: 2021-05-01 | Disposition: A | Discharge status: Home / Self Care | Payer: BC Managed Care – PPO | Source: Ambulatory Visit | Attending: Thoracic Surgery (Cardiothoracic Vascular Surgery) | Admitting: Thoracic Surgery (Cardiothoracic Vascular Surgery)

## 2021-05-01 VITALS — BP 122/80 | HR 104 | Temp 98.1°F | Resp 20 | Ht 65.0 in | Wt 114.0 lb

## 2021-05-01 DIAGNOSIS — Z9889 Other specified postprocedural states: Secondary | ICD-10-CM

## 2021-05-01 DIAGNOSIS — J948 Other specified pleural conditions: Secondary | ICD-10-CM | POA: Diagnosis not present

## 2021-05-01 DIAGNOSIS — R911 Solitary pulmonary nodule: Secondary | ICD-10-CM

## 2021-05-01 DIAGNOSIS — J939 Pneumothorax, unspecified: Secondary | ICD-10-CM | POA: Diagnosis not present

## 2021-05-01 NOTE — Progress Notes (Signed)
[address]       [place] [address]             [phone]    HPI: Kathleen Hughes returns for a scheduled follow-up visit  Kathleen Hughes is a 45 year old non-smoker who was incidentally found to have a lung nodule earlier this year.  I did a robotic right lower lobe basilar segmentectomy and node dissection on 04/18/2021.  The nodule turned out to be an adenocarcinoma.  There was a single positive level 8 node.  Pathologic stage was T1, N2, stage IIIa.  She was seen in the office last week.  She was having a lot of pain.  She had tried tramadol but that gave her nausea.  She was only taking Tylenol for the pain.  I gave her a prescription for gabapentin but she never filled that because she was afraid of side effects.  She is very sensitive to a lot of medications.  She currently only taking Tylenol at night before she goes to bed.  She still waiting on approval for her lidocaine patches.  Her pain has improved.  She does complain of sensitivity in the right flank and abdomen region.  Past Medical History:  Diagnosis Date  . Atypical mole 01/19/2014   mild right breast  . Basal cell carcinoma 01/17/2009   nose bowens  tx-aldara  . Basal cell carcinoma of skin 03/13/2021   nod-left breast ([facility])  . BCC (basal cell carcinoma of skin) 07/08/2013   chest tx-cx3 54fu  . Breast mass, right 08/24/2013   1 at 12-1 o'clock and 1 at 3 o'clock will get mammogram and [place] at breast center  . Contraceptive management 02/15/2015  . Migraines   . PONV (postoperative nausea and vomiting)   . Recurrent cold sores   . UTI (lower urinary tract infection) 02/15/2015    Current Outpatient Medications  Medication Sig Dispense Refill  . acetaminophen (TYLENOL) 500 MG tablet Take 2 tablets (1,000 mg total) by mouth every 6 (six) hours as needed. 30 tablet 0  . norethindrone (NORLYDA) 0.35 MG tablet TAKE 1 TABLET BY MOUTH DAILY. GENERIC EQUIVALENT TO NORA-BE (Patient taking differently:  Take 1 tablet by mouth daily. TAKE 1 TABLET BY MOUTH DAILY. GENERIC EQUIVALENT TO NORA-BE) 84 tablet 4  . topiramate (TOPAMAX) 25 MG tablet Take 25 mg by mouth daily.    Marland Kitchen lidocaine (LIDODERM) 5 % Place 1 patch onto the skin daily. Remove & Discard patch within 12 hours or as directed by MD (Patient not taking: Reported on 05/01/2021) 30 patch 0  . ondansetron (ZOFRAN) 4 MG tablet Take 1 tablet (4 mg total) by mouth every 8 (eight) hours as needed for nausea or vomiting. (Patient not taking: Reported on 05/01/2021) 20 tablet 0   No current facility-administered medications for this visit.    Physical Exam BP 122/80   Pulse (!) 104   Temp 98.1 F (36.7 C) (Skin)   Resp 20   Ht 5\' 5"  (1.651 m)   Wt 114 lb (51.7 kg)   LMP 04/30/2021   SpO2 98% Comment: RA  BMI 18.5 kg/m  45 year old woman in no acute distress Alert and oriented x3 with no focal deficits Lungs diminished at right base, otherwise clear Incisions healing well  Diagnostic Tests: I personally reviewed the chest x-ray.  It shows improvement from her film of 6 days ago.  There is a left pneumothorax.  There is an effusion occupying the space previously  occupied by the basilar segments.  Impression: Kathleen Hughes is a 45 year old non-smoker who was incidentally found to have a lung nodule back in December.  On follow-up that nodule had increased in size and was hypermetabolic on PET.  I did a robotic right lower lobe basilar segmentectomy and node dissection on 04/18/2021.  It turned out to be a stage IIIa (T1, N2) adenocarcinoma.  She has seen Dr. Julien Nordmann.  They are waiting on molecular testing results before proceeding with treatment.  From a surgical standpoint she is actually doing quite well.  She does still have discomfort which is not surprising 2 weeks out from surgery.  That should continue to improve.  She does not want to try gabapentin.  She is not taking any narcotics.  She was not even taking nonsteroidals.  If her  pain worsens particular neuropathic type pain I encouraged her to contact me and we can reconsider options for treatment.  She does have a bit of a cough.  I offered her a trial of prednisone taper.  She does not want to do that.   Plan: Follow-up with Dr. Julien Nordmann Return in 3 weeks with PA lateral chest x-ray to check on progress  Melrose Nakayama, MD [facility] [phone]

## 2021-05-02 ENCOUNTER — Encounter: Payer: Self-pay | Admitting: *Deleted

## 2021-05-02 ENCOUNTER — Telehealth: Payer: Self-pay | Admitting: *Deleted

## 2021-05-02 NOTE — Telephone Encounter (Signed)
I called Kathleen Hughes today to update her that her molecular test estimate time of results is 5/31.  I was unable to reach her nor leave vm message.

## 2021-05-02 NOTE — Progress Notes (Signed)
I followed up on Kathleen Hughes's molecular and PDL 1 results.  Per the patient portal [facility] has received her tissue for testing.  There is no information on when results will be completed at this time. Will check later.

## 2021-05-04 ENCOUNTER — Encounter (HOSPITAL_COMMUNITY): Payer: Self-pay

## 2021-05-05 ENCOUNTER — Ambulatory Visit (HOSPITAL_COMMUNITY)
Admission: RE | Admit: 2021-05-05 | Discharge: 2021-05-05 | Disposition: A | Discharge status: Home / Self Care | Payer: BC Managed Care – PPO | Source: Ambulatory Visit | Attending: Internal Medicine | Admitting: Internal Medicine

## 2021-05-05 ENCOUNTER — Other Ambulatory Visit: Payer: Self-pay

## 2021-05-05 DIAGNOSIS — R519 Headache, unspecified: Secondary | ICD-10-CM | POA: Diagnosis not present

## 2021-05-05 DIAGNOSIS — G43909 Migraine, unspecified, not intractable, without status migrainosus: Secondary | ICD-10-CM | POA: Diagnosis not present

## 2021-05-05 DIAGNOSIS — C3490 Malignant neoplasm of unspecified part of unspecified bronchus or lung: Secondary | ICD-10-CM | POA: Insufficient documentation

## 2021-05-05 MED ORDER — GADOBUTROL 1 MMOL/ML IV SOLN
5.0000 mL | Freq: Once | INTRAVENOUS | Status: AC | PRN
  Administered 2021-05-05: 5 mL via INTRAVENOUS

## 2021-05-07 ENCOUNTER — Telehealth: Payer: Self-pay | Admitting: Medical Oncology

## 2021-05-07 ENCOUNTER — Encounter (HOSPITAL_COMMUNITY): Payer: Self-pay

## 2021-05-07 DIAGNOSIS — C44511 Basal cell carcinoma of skin of breast: Secondary | ICD-10-CM | POA: Diagnosis not present

## 2021-05-07 NOTE — Telephone Encounter (Signed)
Asking for MRI results.

## 2021-05-07 NOTE — Telephone Encounter (Signed)
Per Dr Julien Nordmann I told pt her MRI of brain was " negative". She said she is very relieved. She has not heard about a follow up appt.   Message sent to [facility].

## 2021-05-08 ENCOUNTER — Telehealth: Payer: Self-pay | Admitting: *Deleted

## 2021-05-08 NOTE — Telephone Encounter (Signed)
I called to set up a follow up appt but was unable to reach nor leave vm message.

## 2021-05-08 NOTE — Telephone Encounter (Signed)
I tried to call Ms. Delval today but was unable to reach or leave vm message.

## 2021-05-10 ENCOUNTER — Encounter (HOSPITAL_COMMUNITY): Payer: Self-pay

## 2021-05-10 DIAGNOSIS — C3491 Malignant neoplasm of unspecified part of right bronchus or lung: Secondary | ICD-10-CM | POA: Diagnosis not present

## 2021-05-16 ENCOUNTER — Encounter: Payer: Self-pay | Admitting: Internal Medicine

## 2021-05-16 ENCOUNTER — Encounter: Payer: Self-pay | Admitting: *Deleted

## 2021-05-16 ENCOUNTER — Inpatient Hospital Stay: Payer: BC Managed Care – PPO | Attending: Internal Medicine | Admitting: Internal Medicine

## 2021-05-16 ENCOUNTER — Inpatient Hospital Stay: Payer: BC Managed Care – PPO

## 2021-05-16 ENCOUNTER — Other Ambulatory Visit: Payer: Self-pay

## 2021-05-16 VITALS — BP 114/75 | HR 78 | Temp 97.9°F | Resp 18 | Ht 65.0 in | Wt 116.3 lb

## 2021-05-16 DIAGNOSIS — Z79899 Other long term (current) drug therapy: Secondary | ICD-10-CM | POA: Diagnosis not present

## 2021-05-16 DIAGNOSIS — C3431 Malignant neoplasm of lower lobe, right bronchus or lung: Secondary | ICD-10-CM | POA: Insufficient documentation

## 2021-05-16 DIAGNOSIS — Z902 Acquired absence of lung [part of]: Secondary | ICD-10-CM | POA: Insufficient documentation

## 2021-05-16 DIAGNOSIS — Z5111 Encounter for antineoplastic chemotherapy: Secondary | ICD-10-CM

## 2021-05-16 DIAGNOSIS — C3490 Malignant neoplasm of unspecified part of unspecified bronchus or lung: Secondary | ICD-10-CM

## 2021-05-16 DIAGNOSIS — C3491 Malignant neoplasm of unspecified part of right bronchus or lung: Secondary | ICD-10-CM | POA: Diagnosis not present

## 2021-05-16 LAB — CMP (CANCER CENTER ONLY)
ALT: 10 U/L (ref 0–44)
AST: 13 U/L — ABNORMAL LOW (ref 15–41)
Albumin: 4.1 g/dL (ref 3.5–5.0)
Alkaline Phosphatase: 82 U/L (ref 38–126)
Anion gap: 10 (ref 5–15)
BUN: 9 mg/dL (ref 6–20)
CO2: 23 mmol/L (ref 22–32)
Calcium: 9.2 mg/dL (ref 8.9–10.3)
Chloride: 109 mmol/L (ref 98–111)
Creatinine: 0.86 mg/dL (ref 0.44–1.00)
GFR, Estimated: >60 mL/min (ref >60)
Glucose, Bld: 82 mg/dL (ref 70–99)
Potassium: 3.6 mmol/L (ref 3.5–5.1)
Sodium: 142 mmol/L (ref 135–145)
Total Bilirubin: 0.3 mg/dL (ref 0.3–1.2)
Total Protein: 7.1 g/dL (ref 6.5–8.1)

## 2021-05-16 LAB — CBC WITH DIFFERENTIAL (CANCER CENTER ONLY)
Abs Immature Granulocytes: 0.01 10*3/uL (ref 0.00–0.07)
Basophils Absolute: 0.1 10*3/uL (ref 0.0–0.1)
Basophils Relative: 1 %
Eosinophils Absolute: 0.2 10*3/uL (ref 0.0–0.5)
Eosinophils Relative: 4 %
HCT: 37.9 % (ref 36.0–46.0)
Hemoglobin: 12.7 g/dL (ref 12.0–15.0)
Immature Granulocytes: 0 %
Lymphocytes Relative: 23 %
Lymphs Abs: 1.1 10*3/uL (ref 0.7–4.0)
MCH: 32.1 pg (ref 26.0–34.0)
MCHC: 33.5 g/dL (ref 30.0–36.0)
MCV: 95.7 fL (ref 80.0–100.0)
Monocytes Absolute: 0.3 10*3/uL (ref 0.1–1.0)
Monocytes Relative: 5 %
Neutro Abs: 3.2 10*3/uL (ref 1.7–7.7)
Neutrophils Relative %: 67 %
Platelet Count: 291 10*3/uL (ref 150–400)
RBC: 3.96 MIL/uL (ref 3.87–5.11)
RDW: 11.5 % (ref 11.5–15.5)
WBC Count: 4.9 10*3/uL (ref 4.0–10.5)
nRBC: 0 % (ref 0.0–0.2)

## 2021-05-16 MED ORDER — CYANOCOBALAMIN 1000 MCG/ML IJ SOLN
INTRAMUSCULAR | Status: AC
  Filled 2021-05-16: qty 1

## 2021-05-16 MED ORDER — PROCHLORPERAZINE MALEATE 10 MG PO TABS: 10.0000 mg | ORAL_TABLET | Freq: Four times a day (QID) | ORAL | 0 refills | Status: DC | PRN

## 2021-05-16 MED ORDER — CYANOCOBALAMIN 1000 MCG/ML IJ SOLN
1000.0000 ug | Freq: Once | INTRAMUSCULAR | Status: AC
  Administered 2021-05-16: 1000 ug via INTRAMUSCULAR

## 2021-05-16 MED ORDER — FOLIC ACID 1 MG PO TABS: 1.0000 mg | ORAL_TABLET | Freq: Every day | ORAL | 4 refills | Status: DC

## 2021-05-16 MED ORDER — DEXAMETHASONE 4 MG PO TABS: ORAL_TABLET | ORAL | 0 refills | Status: DC

## 2021-05-16 NOTE — Progress Notes (Signed)
START ON PATHWAY REGIMEN - Non-Small Cell Lung     A cycle is every 21 days:     Pemetrexed      Cisplatin   **Always confirm dose/schedule in your pharmacy ordering system**  Patient Characteristics: Postoperative without Neoadjuvant Therapy (Pathologic Staging), Stage III, Adjuvant Chemotherapy, Nonsquamous Cell Therapeutic Status: Postoperative without Neoadjuvant Therapy (Pathologic Staging) AJCC T Category: pT1b AJCC N Category: pN2 AJCC M Category: cM0 AJCC 8 Stage Grouping: IIIA Histology: Nonsquamous Cell Intent of Therapy: Curative Intent, Discussed with Patient

## 2021-05-16 NOTE — Progress Notes (Signed)
  Oncology Nurse Navigator Documentation  Oncology Nurse Navigator Flowsheets 05/16/2021  Abnormal Finding Date 03/08/2021  Confirmed Diagnosis Date 04/18/2021  Diagnosis Status Confirmed Diagnosis Complete  Planned Course of Treatment Surgery;Chemotherapy  Phase of Treatment Chemo  Surgery Actual Start Date: 04/18/2021  Navigator Follow Up Date: 05/22/2021  Navigator Follow Up Reason: Appointment Review  Navigator Location [facility]  Referral Date to RadOnc/MedOnc 04/25/2021  Navigator Encounter Type Follow-up Appt/spoke with patient and her husband today.  I help to educate on plan of care and chemo medications. I also provided handouts.   Patient verbalized interest in healthcare power of attorney.  I updated CSW to reach out to patient with updates.   Treatment Initiated Date 04/18/2021  Patient Visit Type MedOnc  Treatment Phase Pre-Tx/Tx Discussion  Barriers/Navigation Needs Education  Education Other;Newly Diagnosed Cancer Education  Interventions Psycho-Social Support;Referrals;Coordination of Care  Acuity Level 2-Minimal Needs (1-2 Barriers Identified)  Referrals Social Work  Coordination of Care Other  Time Spent with Patient 45

## 2021-05-16 NOTE — Patient Instructions (Signed)
Pemetrexed injection What is this medicine? PEMETREXED (PEM e TREX ed) is a chemotherapy drug used to treat lung cancers like non-small cell lung cancer and mesothelioma. It may also be used to treat other cancers. This medicine may be used for other purposes; ask your health care provider or pharmacist if you have questions. COMMON BRAND NAME(S): Alimta What should I tell my health care provider before I take this medicine? They need to know if you have any of these conditions:  infection (especially a virus infection such as chickenpox, cold sores, or herpes)  kidney disease  low blood counts, like low white cell, platelet, or red cell counts  lung or breathing disease, like asthma  radiation therapy  an unusual or allergic reaction to pemetrexed, other medicines, foods, dyes, or preservative  pregnant or trying to get pregnant  breast-feeding How should I use this medicine? This drug is given as an infusion into a vein. It is administered in a hospital or clinic by a specially trained health care professional. Talk to your pediatrician regarding the use of this medicine in children. Special care may be needed. Overdosage: If you think you have taken too much of this medicine contact a poison control center or emergency room at once. NOTE: This medicine is only for you. Do not share this medicine with others. What if I miss a dose? It is important not to miss your dose. Call your doctor or health care professional if you are unable to keep an appointment. What may interact with this medicine? This medicine may interact with the following medications:  Ibuprofen This list may not describe all possible interactions. Give your health care provider a list of all the medicines, herbs, non-prescription drugs, or dietary supplements you use. Also tell them if you smoke, drink alcohol, or use illegal drugs. Some items may interact with your medicine. What should I watch for while using  this medicine? Visit your doctor for checks on your progress. This drug may make you feel generally unwell. This is not uncommon, as chemotherapy can affect healthy cells as well as cancer cells. Report any side effects. Continue your course of treatment even though you feel ill unless your doctor tells you to stop. In some cases, you may be given additional medicines to help with side effects. Follow all directions for their use. Call your doctor or health care professional for advice if you get a fever, chills or sore throat, or other symptoms of a cold or flu. Do not treat yourself. This drug decreases your body's ability to fight infections. Try to avoid being around people who are sick. This medicine may increase your risk to bruise or bleed. Call your doctor or health care professional if you notice any unusual bleeding. Be careful brushing and flossing your teeth or using a toothpick because you may get an infection or bleed more easily. If you have any dental work done, tell your dentist you are receiving this medicine. Avoid taking products that contain aspirin, acetaminophen, ibuprofen, naproxen, or ketoprofen unless instructed by your doctor. These medicines may hide a fever. Call your doctor or health care professional if you get diarrhea or mouth sores. Do not treat yourself. To protect your kidneys, drink water or other fluids as directed while you are taking this medicine. Do not become pregnant while taking this medicine or for 6 months after stopping it. Women should inform their doctor if they wish to become pregnant or think they might be pregnant. Men should  not father a child while taking this medicine and for 3 months after stopping it. This may interfere with the ability to father a child. You should talk to your doctor or health care professional if you are concerned about your fertility. There is a potential for serious side effects to an unborn child. Talk to your health care  professional or pharmacist for more information. Do not breast-feed an infant while taking this medicine or for 1 week after stopping it. What side effects may I notice from receiving this medicine? Side effects that you should report to your doctor or health care professional as soon as possible:  allergic reactions like skin rash, itching or hives, swelling of the face, lips, or tongue  breathing problems  redness, blistering, peeling or loosening of the skin, including inside the mouth  signs and symptoms of bleeding such as bloody or black, tarry stools; red or dark-brown urine; spitting up blood or brown material that looks like coffee grounds; red spots on the skin; unusual bruising or bleeding from the eye, gums, or nose  signs and symptoms of infection like fever or chills; cough; sore throat; pain or trouble passing urine  signs and symptoms of kidney injury like trouble passing urine or change in the amount of urine  signs and symptoms of liver injury like dark yellow or brown urine; general ill feeling or flu-like symptoms; light-colored stools; loss of appetite; nausea; right upper belly pain; unusually weak or tired; yellowing of the eyes or skin Side effects that usually do not require medical attention (report to your doctor or health care professional if they continue or are bothersome):  constipation  mouth sores  nausea, vomiting  unusually weak or tired This list may not describe all possible side effects. Call your doctor for medical advice about side effects. You may report side effects to FDA at 1-800-FDA-1088. Where should I keep my medicine? This drug is given in a hospital or clinic and will not be stored at home. NOTE: This sheet is a summary. It may not cover all possible information. If you have questions about this medicine, talk to your doctor, pharmacist, or health care provider.  2021 Elsevier/Gold Standard (2018-01-21 16:11:33) Cisplatin  injection What is this medicine? CISPLATIN (SIS pla tin) is a chemotherapy drug. It targets fast dividing cells, like cancer cells, and causes these cells to die. This medicine is used to treat many types of cancer like bladder, ovarian, and testicular cancers. This medicine may be used for other purposes; ask your health care provider or pharmacist if you have questions. COMMON BRAND NAME(S): Platinol, Platinol -AQ What should I tell my health care provider before I take this medicine? They need to know if you have any of these conditions:  eye disease, vision problems  hearing problems  kidney disease  low blood counts, like white cells, platelets, or red blood cells  tingling of the fingers or toes, or other nerve disorder  an unusual or allergic reaction to cisplatin, carboplatin, oxaliplatin, other medicines, foods, dyes, or preservatives  pregnant or trying to get pregnant  breast-feeding How should I use this medicine? This drug is given as an infusion into a vein. It is administered in a hospital or clinic by a specially trained health care professional. Talk to your pediatrician regarding the use of this medicine in children. Special care may be needed. Overdosage: If you think you have taken too much of this medicine contact a poison control center or emergency room  at once. NOTE: This medicine is only for you. Do not share this medicine with others. What if I miss a dose? It is important not to miss a dose. Call your doctor or health care professional if you are unable to keep an appointment. What may interact with this medicine? This medicine may interact with the following medications:  foscarnet  certain antibiotics like amikacin, gentamicin, neomycin, polymyxin B, streptomycin, tobramycin, vancomycin This list may not describe all possible interactions. Give your health care provider a list of all the medicines, herbs, non-prescription drugs, or dietary supplements  you use. Also tell them if you smoke, drink alcohol, or use illegal drugs. Some items may interact with your medicine. What should I watch for while using this medicine? Your condition will be monitored carefully while you are receiving this medicine. You will need important blood work done while you are taking this medicine. This drug may make you feel generally unwell. This is not uncommon, as chemotherapy can affect healthy cells as well as cancer cells. Report any side effects. Continue your course of treatment even though you feel ill unless your doctor tells you to stop. This medicine may increase your risk of getting an infection. Call your healthcare professional for advice if you get a fever, chills, or sore throat, or other symptoms of a cold or flu. Do not treat yourself. Try to avoid being around people who are sick. Avoid taking medicines that contain aspirin, acetaminophen, ibuprofen, naproxen, or ketoprofen unless instructed by your healthcare professional. These medicines may hide a fever. This medicine may increase your risk to bruise or bleed. Call your doctor or health care professional if you notice any unusual bleeding. Be careful brushing and flossing your teeth or using a toothpick because you may get an infection or bleed more easily. If you have any dental work done, tell your dentist you are receiving this medicine. Do not become pregnant while taking this medicine or for 14 months after stopping it. Women should inform their healthcare professional if they wish to become pregnant or think they might be pregnant. Men should not father a child while taking this medicine and for 11 months after stopping it. There is potential for serious side effects to an unborn child. Talk to your healthcare professional for more information. Do not breast-feed an infant while taking this medicine. This medicine has caused ovarian failure in some women. This medicine may make it more difficult to  get pregnant. Talk to your healthcare professional if you are concerned about your fertility. This medicine has caused decreased sperm counts in some men. This may make it more difficult to father a child. Talk to your healthcare professional if you are concerned about your fertility. Drink fluids as directed while you are taking this medicine. This will help protect your kidneys. Call your doctor or health care professional if you get diarrhea. Do not treat yourself. What side effects may I notice from receiving this medicine? Side effects that you should report to your doctor or health care professional as soon as possible:  allergic reactions like skin rash, itching or hives, swelling of the face, lips, or tongue  blurred vision  changes in vision  decreased hearing or ringing of the ears  nausea, vomiting  pain, redness, or irritation at site where injected  pain, tingling, numbness in the hands or feet  signs and symptoms of bleeding such as bloody or black, tarry stools; red or dark brown urine; spitting up blood or  brown material that looks like coffee grounds; red spots on the skin; unusual bruising or bleeding from the eyes, gums, or nose  signs and symptoms of infection like fever; chills; cough; sore throat; pain or trouble passing urine  signs and symptoms of kidney injury like trouble passing urine or change in the amount of urine  signs and symptoms of low red blood cells or anemia such as unusually weak or tired; feeling faint or lightheaded; falls; breathing problems Side effects that usually do not require medical attention (report to your doctor or health care professional if they continue or are bothersome):  loss of appetite  mouth sores  muscle cramps This list may not describe all possible side effects. Call your doctor for medical advice about side effects. You may report side effects to FDA at 1-800-FDA-1088. Where should I keep my medicine? This drug is  given in a hospital or clinic and will not be stored at home. NOTE: This sheet is a summary. It may not cover all possible information. If you have questions about this medicine, talk to your doctor, pharmacist, or health care provider.  2021 Elsevier/Gold Standard (2018-11-27 15:59:17) Lung Cancer Lung cancer is an abnormal growth of cancerous cells that forms a mass (malignant tumor) in a lung. There are several types of lung cancer. The types are based on the appearance of the tumor cells. The two most common types are:  Non-small cell lung cancer. This type of lung cancer is the most common type. Non-small cell lung cancers include squamous cell carcinoma, adenocarcinoma, and large cell carcinoma.  Small cell lung cancer. In this type of lung cancer, abnormal cells are smaller than those of non-small cell lung cancer. Small cell lung cancer gets worse (progresses) faster than non-small cell lung cancer. What are the causes? The most common cause of lung cancer is smoking tobacco. The second most common cause is exposure to a chemical called radon. What increases the risk? You are more likely to develop this condition if:  You smoke tobacco.  You have been exposed to: ? Secondhand tobacco smoke. ? Radon gas. ? Uranium. ? Asbestos. ? Arsenic in drinking water. ? Air pollution.  You have a family or personal history of lung cancer.  You have had lung radiation therapy in the past.  You are older than age 66. What are the signs or symptoms? In the early stages, you may not have any symptoms. As the cancer progresses, symptoms may include:  A lasting cough, possibly with blood.  Fatigue.  Unexplained weight loss.  Shortness of breath.  Loud breathing (wheezing).  Chest pain.  Loss of appetite. Symptoms of advanced lung cancer include:  Hoarseness.  Bone or joint pain.  Weakness.  Change in the structure of the fingernails (clubbing), so that the nail looks like an  upside-down spoon.  Swelling of the face or arms.  Inability to move the face (paralysis).  Drooping eyelids. How is this diagnosed? This condition may be diagnosed based on:  Your symptoms and medical history.  A physical exam.  A chest X-ray.  A CT scan.  Blood tests.  Sputum tests.  Removal of a sample of lung tissue (lung biopsy) for testing. Your cancer will be assessed (staged) to determine how severe it is and how much it has spread (metastasized). How is this treated? Treatment depends on the type and stage of your cancer. Treatment may include one or more of the following:  Surgery to remove as much of  the cancer as possible. Lymph nodes in the area may be removed and tested for cancer as well.  Medicines that kill cancer cells (chemotherapy).  High-energy rays that kill cancer cells (radiation therapy).  Targeted therapy. This targets specific parts of cancer cells and the area around them to block the growth and spread of the cancer. Targeted therapy can help limit the damage to healthy cells. Follow these instructions at home: Eating and drinking  Some of your treatments might affect your appetite. If you are having problems eating, or if you do not have an appetite, meet with a dietitian.  If you have side effects that affect your appetite, it may help to: ? Eat smaller meals and snacks often. ? Drink high-nutrition and high-calorie shakes or supplements. ? Eat bland and soft foods that are easy to eat. ? Avoid eating foods that are hot, spicy, or hard to swallow. General instructions  Do not use any products that contain nicotine or tobacco, such as cigarettes and e-cigarettes. If you need help quitting, ask your health care provider.  Do not drink alcohol.  If you are admitted to the hospital, make sure your cancer specialist (oncologist) is aware. Your cancer may affect your treatment for other conditions.  Take over-the-counter and prescription  medicines only as told by your health care provider.  Consider joining a support group for people who have been diagnosed with lung cancer.  Work with your health care provider to manage any side effects of treatment.  Keep all follow-up visits as told by your health care provider. This is important.   Where to find more information  American Cancer Society: https://www.cancer.[place]): https://www.cancer.gov Contact a health care provider if you:  Lose weight without trying.  Have a persistent cough and wheezing.  Feel short of breath.  Get tired easily.  Have bone or joint pain.  Have difficulty swallowing.  Notice that your voice is changing or getting hoarse.  Have pain that does not get better with medicine. Get help right away if you:  Cough up blood.  Have new breathing problems.  Have chest pain.  Have a fever.  Have swelling in an ankle, leg, or arm, or the face or neck.  Have paralysis in your face.  Are very confused.  Have a drooping eyelid. Summary  Lung cancer is an abnormal growth of cancerous cells that forms a mass (malignant tumor) in a lung.  There are several types of lung cancer. The types are based on the appearance of the tumor cells. The two most common types are non-small cell and small cell.  The most common cause of lung cancer is smoking tobacco.  Early symptoms include a lasting cough, possibly with blood, and fatigue, unexplained weight loss, and shortness of breath.  After diagnosis, treatment depends on the type and stage of your cancer. This information is not intended to replace advice given to you by your health care provider. Make sure you discuss any questions you have with your health care provider. Document Revised: 08/03/2020 Document Reviewed: 08/03/2020 Elsevier Patient Education  2021 [employer].

## 2021-05-16 NOTE — Progress Notes (Signed)
[place] Telephone:([phone]) [phone]   Fax:(336) [phone]  OFFICE PROGRESS NOTE  Practice, [facility] [place] [address]  DIAGNOSIS: Stage IIIA (T1b, N2, M0) non-small cell lung cancer, well differentiated invasive adenocarcinoma, 75% acinar and 25% papillary presented with right lower lobe lung nodule diagnosed in April 2022.   Biomarker Findings Microsatellite status - MS-Stable Tumor Mutational Burden - 1 Muts/Mb Genomic Findings For a complete list of the genes assayed, please refer to the Appendix. ROS1 ROS1-ATXN7L2 rearrangement, EZR-ROS1 fusion TP53 [id number] 7 Disease relevant genes with no reportable alterations: ALK, BRAF, EGFR, ERBB2, KRAS, MET, RET  PDL Expression: 30%  PRIOR THERAPY: Status post right lower lobe wedge resection with lymph node dissection under the care of Dr. Roxan Hockey on 04/18/2021.  CURRENT THERAPY: Adjuvant systemic chemotherapy with cisplatin 75 Mg/M2 and Alimta 500 Mg/M2 every 3 weeks.  First dose 05/29/2021.  INTERVAL HISTORY: Kathleen Hughes 45 y.o. female returns to the clinic today for follow-up visit accompanied by her husband.  The patient is feeling fine today with no concerning complaints except for the postoperative pain with radiation to the right lower rib cage anteriorly.  The patient denied having any shortness of breath, cough or hemoptysis.  She denied having any fever or chills.  She has no nausea, vomiting, diarrhea or constipation.  She has no headache or visual changes.  She denied having any recent weight loss or night sweats.  She had several studies performed recently including MRI of the brain that was negative for metastatic disease to the brain.  The patient also had molecular studies by foundation 1 as well as PD-L1 expression and she is here for evaluation and discussion of her molecular studies as well as treatment options.  MEDICAL HISTORY: Past Medical History:  Diagnosis Date  .  Atypical mole 01/19/2014   mild right breast  . Basal cell carcinoma 01/17/2009   nose bowens  tx-aldara  . Basal cell carcinoma of skin 03/13/2021   nod-left breast (MOHS)  . BCC (basal cell carcinoma of skin) 07/08/2013   chest tx-cx3 60f  . Breast mass, right 08/24/2013   1 at 12-1 o'clock and 1 at 3 o'clock [place] [facility] [place]  . Contraceptive management 02/15/2015  . Migraines   . PONV (postoperative nausea and vomiting)   . Recurrent cold sores   . UTI (lower urinary tract infection) 02/15/2015    ALLERGIES:  is allergic to codone [hydrocodone].  MEDICATIONS:  Current Outpatient Medications  Medication Sig Dispense Refill  . acetaminophen (TYLENOL) 500 MG tablet Take 2 tablets (1,000 mg total) by mouth every 6 (six) hours as needed. 30 tablet 0  . lidocaine (LIDODERM) 5 % Place 1 patch onto the skin daily. Remove & Discard patch within 12 hours or as directed by MD (Patient not taking: Reported on 05/01/2021) 30 patch 0  . norethindrone (NORLYDA) 0.35 MG tablet TAKE 1 TABLET BY MOUTH DAILY. GENERIC EQUIVALENT TO NORA-BE (Patient taking differently: Take 1 tablet by mouth daily. TAKE 1 TABLET BY MOUTH DAILY. GENERIC EQUIVALENT TO NORA-BE) 84 tablet 4  . ondansetron (ZOFRAN) 4 MG tablet Take 1 tablet (4 mg total) by mouth every 8 (eight) hours as needed for nausea or vomiting. (Patient not taking: Reported on 05/01/2021) 20 tablet 0  . topiramate (TOPAMAX) 25 MG tablet Take 25 mg by mouth daily.     No current facility-administered medications for this visit.    SURGICAL  HISTORY:  Past Surgical History:  Procedure Laterality Date  . BREAST BIOPSY Right   . CESAREAN SECTION    . INTERCOSTAL NERVE BLOCK Right 04/18/2021   Procedure: INTERCOSTAL NERVE BLOCK;  Surgeon: Melrose Nakayama, MD;  Location: [facility];  Service: Thoracic;  Laterality: Right;  . NODE DISSECTION Right 04/18/2021   Procedure: NODE DISSECTION;  Surgeon: Melrose Nakayama, MD;  Location: [facility];  Service: Thoracic;  Laterality: Right;    REVIEW OF SYSTEMS:  Constitutional: negative Eyes: negative Ears, nose, mouth, throat, and face: negative Respiratory: positive for pleurisy/chest pain Cardiovascular: negative Gastrointestinal: negative Genitourinary:negative Integument/breast: negative Hematologic/lymphatic: negative Musculoskeletal:negative Neurological: negative Behavioral/Psych: negative Endocrine: negative Allergic/Immunologic: negative   PHYSICAL EXAMINATION: General appearance: alert, cooperative and no distress Head: Normocephalic, without obvious abnormality, atraumatic Neck: no adenopathy, no JVD, supple, symmetrical, trachea midline and thyroid not enlarged, symmetric, no tenderness/mass/nodules Lymph nodes: Cervical, supraclavicular, and axillary nodes normal. Resp: clear to auscultation bilaterally Back: symmetric, no curvature. ROM normal. No CVA tenderness. Cardio: regular rate and rhythm, S1, S2 normal, no murmur, click, rub or gallop GI: soft, non-tender; bowel sounds normal; no masses,  no organomegaly Extremities: extremities normal, atraumatic, no cyanosis or edema Neurologic: Alert and oriented X 3, normal strength and tone. Normal symmetric reflexes. Normal coordination and gait  ECOG PERFORMANCE STATUS: 0 - Asymptomatic  Blood pressure 114/75, pulse 78, temperature 97.9 F (36.6 C), resp. rate 18, height _0  (1.651 m), weight 116 lb 4.8 oz (52.8 kg), last menstrual period 04/30/2021, SpO2 100 %.  LABORATORY DATA: Lab Results  Component Value Date   WBC 4.9 05/16/2021   HGB 12.7 05/16/2021   HCT 37.9 05/16/2021   MCV 95.7 05/16/2021   PLT 291 05/16/2021      Chemistry      Component Value Date/Time   NA 140 04/26/2021 1442   NA 138 02/15/2015 0912   K 4.0 04/26/2021 1442   CL 106 04/26/2021 1442   CO2 25 04/26/2021 1442   BUN 9 04/26/2021 1442   BUN 8 02/15/2015 0912   CREATININE 0.82 04/26/2021 1442      Component Value  Date/Time   CALCIUM 9.3 04/26/2021 1442   ALKPHOS 43 04/20/2021 0046   AST 21 04/20/2021 0046   ALT 17 04/20/2021 0046   BILITOT 0.5 04/20/2021 0046   BILITOT 0.5 02/15/2015 0912       RADIOGRAPHIC STUDIES: DG Chest 1 View  Result Date: 04/19/2021 CLINICAL DATA:  Chest tube. EXAM: CHEST  1 VIEW COMPARISON:  04/18/2021. FINDINGS: Right chest tube in stable position. Interim near complete resolution of right pneumothorax with minimal right apical residual. Postsurgical changes medial right lung with associated atelectasis. No pleural effusion. Heart size stable. Known pneumoperitoneum again noted. IMPRESSION: 1. Right chest tube in stable position. Interim near complete resolution of right pneumothorax with minimal right apical residual. 2. Postsurgical changes medial right lung with associated atelectasis. 3.  Known pneumoperitoneum again noted. Electronically Signed   By: Marcello Moores  Register   On: 04/19/2021 07:00   DG Chest 2 View  Result Date: 05/01/2021 CLINICAL DATA:  Status post right-sided lung resection. EXAM: CHEST - 2 VIEW COMPARISON:  Apr 24, 2021. FINDINGS: The heart size and mediastinal contours are within normal limits. Left lung is clear. Right hydropneumothorax is noted with significantly decreased apical component. The visualized skeletal structures are unremarkable. IMPRESSION: Continued presence of right hydropneumothorax with significantly decreased apical component. Electronically Signed   By: Bobbe Medico.D.  On: 05/01/2021 09:18   DG Chest 2 View  Result Date: 04/24/2021 CLINICAL DATA:  Status post right lower lobe wedge resection. EXAM: CHEST - 2 VIEW COMPARISON:  04/22/2021 FINDINGS: Persistent right hydropneumothorax with slight decrease in volume of pleural gas. Left lung remains clear. The cardiopericardial silhouette is within normal limits for size. The visualized bony structures of the thorax show no acute abnormality. IMPRESSION: Persistent right  hydropneumothorax with slight decrease in volume of pleural gas. Electronically Signed   By: Misty Stanley M.D.   On: 04/24/2021 14:16   DG Chest 2 View  Result Date: 04/22/2021 CLINICAL DATA:  Follow-up pneumothorax, recent lobectomy EXAM: CHEST - 2 VIEW COMPARISON:  Chest radiograph from one day prior. FINDINGS: Stable cardiomediastinal silhouette with normal heart size. Moderate right ex vacuo hydropneumothorax, overall stable size with new small layering pleural effusion component. No mediastinal shift. No left pneumothorax. Stable trace left pleural vision. Surgical suture lines in the right hilar region. Similar hazy lower right lung opacity compatible with atelectasis. Clear left lung. No pulmonary edema. Stable mild subcutaneous emphysema in the lower right chest wall. IMPRESSION: 1. Moderate right ex vacuo hydropneumothorax, overall stable size with new small layering pleural effusion component. No mediastinal shift. 2. Similar hazy lower right lung opacity compatible with atelectasis. Electronically Signed   By: Ilona Sorrel M.D.   On: 04/22/2021 08:25   DG Chest 2 View  Result Date: 04/21/2021 CLINICAL DATA:  Follow-up chest tube removal EXAM: CHEST - 2 VIEW COMPARISON:  Film from earlier in the same day. FINDINGS: Previously seen right-sided chest tube has been removed in the interval. There is increase in right-sided pneumothorax with approximately 3.6 cm excursion at the apex as well as a significant hydropneumothorax component in the base. Left lung remains clear. No bony abnormality is seen. Postsurgical changes are again noted on the right. IMPRESSION: Interval chest tube removal with increase in right-sided pneumothorax with a basilar hydropneumothorax component. These results will be called to the ordering clinician or representative by the Radiologist Assistant, and communication documented in the PACS or [employer]. Electronically Signed   By: Inez Catalina M.D.   On: 04/21/2021 15:12    DG Chest 2 View  Result Date: 04/17/2021 CLINICAL DATA:  Follow-up right lower lobe pulmonary nodule. EXAM: CHEST - 2 VIEW COMPARISON:  Chest CT dated 03/08/2021.  PET-CT dated 03/23/2021. FINDINGS: Normal sized heart. Clear lungs. The previously demonstrated right lower lobe lung nodule cannot be visualized. No other lung nodules are seen. Mild scoliosis. IMPRESSION: 1. The previously demonstrated right lower lobe nodule cannot be visualized radiographically. This would need to be followed with chest CT if not biopsied. 2. Otherwise, unremarkable examination. Electronically Signed   By: Claudie Revering M.D.   On: 04/17/2021 16:53   MR Brain W Wo Contrast  Result Date: 05/07/2021 CLINICAL DATA:  Recent diagnosis of non-small cell lung cancer. Staging. Chronic headache. EXAM: MRI HEAD WITHOUT AND WITH CONTRAST TECHNIQUE: Multiplanar, multiecho pulse sequences of the brain and surrounding structures were obtained without and with intravenous contrast. CONTRAST:  34m GADAVIST GADOBUTROL 1 MMOL/ML IV SOLN COMPARISON:  None. FINDINGS: Brain: Diffusion imaging does not show any acute or subacute infarction or other cause of restricted diffusion. No abnormality affects the brainstem. Few small foci of T2 and FLAIR signal within the cerebellar white matter, including the middle cerebellar peduncle on the left. These could be migraine related foci or could relate to demyelinating disease. Cerebral hemispheres show scattered foci of T2 and  FLAIR signal within the deep and subcortical white matter. Again, these may be migraine related foci in this patient with a history of chronic migraine headaches. Demyelinating disease is not excluded but not favored. None of these show restricted diffusion or abnormal enhancement. No sign of metastatic disease. Vascular: Major vessels at the base of the brain show flow. Skull and upper cervical spine: Negative Sinuses/Orbits: Clear/normal Other: None IMPRESSION: No sign of metastatic  disease. Scattered T2 and FLAIR foci within the brain as outlined above, favored to be sequelae of chronic migraine headaches. The possibility of demyelinating disease is considered but not favored. Electronically Signed   By: Nelson Chimes M.D.   On: 05/07/2021 11:57   DG Chest Port 1 View  Result Date: 04/21/2021 CLINICAL DATA:  Status post partial lobectomy EXAM: PORTABLE CHEST 1 VIEW COMPARISON:  Chest radiograph from one day prior. FINDINGS: Right apical chest tube in place. Surgical sutures overlie the medial lower right lung. Stable cardiomediastinal silhouette with normal heart size. Tiny right apical pneumothorax is stable, less than 5%. No left pneumothorax. No pleural effusion. Mild hazy right lung base opacity, stable. Clear left lung. IMPRESSION: 1. Stable tiny right apical pneumothorax. Right apical chest tube in place. 2. Stable mild hazy right lung base opacity, favor atelectasis. Electronically Signed   By: Ilona Sorrel M.D.   On: 04/21/2021 08:29   DG Chest Port 1 View  Result Date: 04/20/2021 CLINICAL DATA:  Chest tube present.  Recent pneumothorax EXAM: PORTABLE CHEST 1 VIEW COMPARISON:  Apr 19, 2021. FINDINGS: Stable chest tube positioning on the right with small apical pneumothorax on the right. No tension component. There is a small right pleural effusion. There is postoperative change in the right lung base medially. Ill-defined opacity in the medial right base may represent postoperative hemorrhage. Lungs elsewhere clear. Heart size and pulmonary vascularity normal. No adenopathy. Pneumoperitoneum again noted. Status post mastectomy on the right. IMPRESSION: Postoperative changes on the right. Suspect hemorrhage in the medial right base at the site of recent surgery. Small right apical pneumothorax with chest tube in place. No tension component. Small right pleural effusion. Left lung clear. Stable cardiac silhouette. Pneumoperitoneum again noted. Electronically Signed   By: Lowella Grip III M.D.   On: 04/20/2021 09:04   DG Chest Port 1 View  Result Date: 04/18/2021 CLINICAL DATA:  Postoperative evaluation EXAM: PORTABLE CHEST 1 VIEW COMPARISON:  Apr 16, 2021 FINDINGS: Chest tube present with tip in the right apex region. There is a sizable pneumothorax on the right without tension component. There is postoperative change medially on the right. No edema or airspace opacity.  Heart size normal. There is apparent pneumoperitoneum. No bone lesions. Status post right mastectomy. IMPRESSION: 1. Chest tube in place with large pneumothorax. No tension component. 2.  Appearance concerning for a degree of pneumoperitoneum. 3. No edema or airspace opacity. Postoperative change medial right lung. 4.  Status post right mastectomy. Critical Value/emergent results were called by telephone at the time of interpretation on 04/18/2021 at 3:32 pm to provider [facility] , who verbally acknowledged these results. Electronically Signed   By: Lowella Grip III M.D.   On: 04/18/2021 15:33    ASSESSMENT AND PLAN: This is a very pleasant 44 years old white female recently diagnosed with a stage IIIA (T1b, N2, M0) non-small cell lung cancer, adenocarcinoma, 75% acinar and 25% papillary presented with right lower lobe lung nodule diagnosed in April 2022. The molecular studies showed positive ROS1 fusion and  PD-L1 expression 30%. I had a lengthy discussion with the patient and her husband today about her current disease stage, prognosis and treatment options. I explained to the patient that even with the positive ROS1 fusion detected on the molecular studies, treatment with ROS1 targeted therapy is not currently approved in the adjuvant setting but this could be used in the future if she has any evidence for disease progression. I discussed with the patient her adjuvant treatment options and recommended for her 4 cycles of adjuvant systemic chemotherapy with cisplatin 75 Mg/M2 and Alimta 500 Mg/M2 every 3  weeks.  This will be followed by adjuvant radiotherapy to the mediastinum.  Also adjuvant immunotherapy with atezolizumab would be an option after completion of the systemic chemotherapy. I discussed with the patient the adverse effect of the chemotherapy including but not limited to mild alopecia, myelosuppression, nausea and vomiting, peripheral neuropathy, liver or renal dysfunction as well as possibility for hearing deficit from the cisplatin.  The patient understands that cisplatin could be associated with delayed nausea and she will be giving nausea medication with Compazine in addition to other antiemetic if needed in the future.  She was also advised to extend her treatment with the Decadron premedication for few more days if she developed delayed nausea. I will arrange for the patient to have a chemotherapy education class before the first dose of her treatment. She will receive vitamin B12 injection today. Will call her pharmacy with prescription for Compazine 10 mg p.o. every 6 hours as needed for nausea in addition to folic acid 1 mg p.o. daily and Decadron 4 mg p.o. twice daily the day before, day of and day after the chemotherapy. She is expected to start the first cycle of this treatment on May 29, 2021. The patient will come back for follow-up visit 1 week after the treatment for management of any adverse effect of her treatment. She was advised to call immediately if she has any other concerning symptoms in the interval. The patient voices understanding of current disease status and treatment options and is in agreement with the current care plan.  All questions were answered. The patient knows to call the clinic with any problems, questions or concerns. We can certainly see the patient much sooner if necessary.  The total time spent in the appointment was 55 minutes.  Disclaimer: This note was dictated with voice recognition software. Similar sounding words can inadvertently be  transcribed and may not be corrected upon review.

## 2021-05-16 NOTE — Progress Notes (Signed)
[facility] Work  Clinical Social Work received referral from Training and development officer for [employer].  CSW contacted patient at home to offer support and resources.  CSW left a voicemail offing information on Advance Directives.  CSW provided contact information and encouraged patient to return call.    Johnnye Lana, MSW, LCSW, OSW-C Clinical Social Worker [facility] [phone]

## 2021-05-18 ENCOUNTER — Telehealth: Payer: Self-pay | Admitting: Nutrition

## 2021-05-18 NOTE — Telephone Encounter (Addendum)
Nutrition referral received to review nutrition therapy during chemotherapy.  45 year old female diagnosed with non-small cell lung cancer followed by Dr. Julien Nordmann.  She will be receiving cisplatin and Pemetrexed.  Past medical history includes postop nausea vomiting, migraines, basal cell carcinoma, and right breast mass.  Medications include Decadron, Folvite, Compazine.  Labs include albumin 4.1.  Height: 5 feet 5 inches. Weight: 116.3 pounds on June 1. Weight ranges between 114-119 pounds per patient. BMI: 19.35. ECOG: 0.  Patient denies nutrition impact symptoms currently.  She denies all food allergies and intolerances to food.  She describes herself as a bit of a picky eater but is willing to do what ever she needs to do during chemotherapy.  Dietary recall reveals patient generally eats peanut butter crackers or fruit and granola for breakfast.  She tries to drink additional water and may have a little bit of soda.  She denies drinking coffee or tea.  For lunch she generally has a salad.  And for dinner she generally eats a salad with vegetables.  She occasionally will eat grilled chicken or cheese but is not "a big protein eater."  Patient verbalizes willingness to add high-protein foods to have her meals and snacks.  Nutrition diagnosis: Food and nutrition related knowledge deficit related to new diagnosis of cancer and associated treatments as evidenced by no prior need for nutrition related information.  Intervention: Educated on importance of smaller more frequent meals and snacks with adequate calories and protein to minimize weight loss throughout treatment. Reviewed high-protein meal and snack options. Encouraged patient to experiment with smoothies to provide additional calories and protein.  Provided recipes. Brief education provided on managing nausea.  Nutrition fact sheets provided.  They were mailed to patient's home address per her request. Questions were answered.   Teach back method used.  Contact information given.  Monitoring, evaluation, goals: Patient will tolerate adequate calories and protein to minimize weight loss throughout treatment.  Next visit: To be scheduled with upcoming treatment.  **Disclaimer: This note was dictated with voice recognition software. Similar sounding words can inadvertently be transcribed and this note may contain transcription errors which may not have been corrected upon publication of note.**

## 2021-05-21 ENCOUNTER — Other Ambulatory Visit: Payer: Self-pay | Admitting: Thoracic Surgery (Cardiothoracic Vascular Surgery)

## 2021-05-21 DIAGNOSIS — C3491 Malignant neoplasm of unspecified part of right bronchus or lung: Secondary | ICD-10-CM

## 2021-05-22 ENCOUNTER — Ambulatory Visit
Admission: RE | Admit: 2021-05-22 | Discharge: 2021-05-22 | Disposition: A | Discharge status: Home / Self Care | Payer: BC Managed Care – PPO | Source: Ambulatory Visit | Attending: Thoracic Surgery (Cardiothoracic Vascular Surgery) | Admitting: Thoracic Surgery (Cardiothoracic Vascular Surgery)

## 2021-05-22 ENCOUNTER — Other Ambulatory Visit: Payer: Self-pay

## 2021-05-22 ENCOUNTER — Ambulatory Visit (INDEPENDENT_AMBULATORY_CARE_PROVIDER_SITE_OTHER): Payer: Self-pay | Admitting: Thoracic Surgery (Cardiothoracic Vascular Surgery)

## 2021-05-22 VITALS — BP 127/86 | HR 94 | Resp 20 | Ht 65.0 in | Wt 114.0 lb

## 2021-05-22 DIAGNOSIS — M419 Scoliosis, unspecified: Secondary | ICD-10-CM | POA: Diagnosis not present

## 2021-05-22 DIAGNOSIS — C3491 Malignant neoplasm of unspecified part of right bronchus or lung: Secondary | ICD-10-CM

## 2021-05-22 DIAGNOSIS — Z9889 Other specified postprocedural states: Secondary | ICD-10-CM

## 2021-05-22 DIAGNOSIS — J90 Pleural effusion, not elsewhere classified: Secondary | ICD-10-CM | POA: Diagnosis not present

## 2021-05-22 NOTE — Progress Notes (Signed)
[address]       [place] [address]             [phone]    HPI: Kathleen Hughes returns for a scheduled follow-up visit after basilar segmentectomy  Kathleen Hughes is a 45 year old woman who was incidentally found to have a lung nodule.  She is a lifelong non-smoker.  She had a robotic right lower lobe basilar segmentectomy and node dissection on 04/18/2021.  There was a single positive level 8 node.  Her final stage was T1, N2, stage IIIa.  Last saw on 05/01/2021.  She was still having some significant incisional pain but was only taking Tylenol.  She was waiting on approval for her lidocaine patches but that never came through.  She had a significant cough as well.  That has improved as has the pain although neither have completely resolved.  Past Medical History:  Diagnosis Date  . Atypical mole 01/19/2014   mild right breast  . Basal cell carcinoma 01/17/2009   nose bowens  tx-aldara  . Basal cell carcinoma of skin 03/13/2021   nod-left breast (MOHS)  . BCC (basal cell carcinoma of skin) 07/08/2013   chest tx-cx3 32fu  . Breast mass, right 08/24/2013   1 at 12-1 o'clock and 1 at 3 o'clock will get mammogram and [place] at breast center  . Contraceptive management 02/15/2015  . Migraines   . PONV (postoperative nausea and vomiting)   . Recurrent cold sores   . UTI (lower urinary tract infection) 02/15/2015    Current Outpatient Medications  Medication Sig Dispense Refill  . acetaminophen (TYLENOL) 500 MG tablet Take 2 tablets (1,000 mg total) by mouth every 6 (six) hours as needed. 30 tablet 0  . dexamethasone (DECADRON) 4 MG tablet 4 mg p.o. twice daily the day before, day of and day after chemotherapy every 3 weeks 40 tablet 0  . folic acid (FOLVITE) 1 MG tablet Take 1 tablet (1 mg total) by mouth daily. 30 tablet 4  . NURTEC 75 MG TBDP Take 1 tablet by mouth daily as needed.    . prochlorperazine (COMPAZINE) 10 MG tablet Take 1 tablet (10 mg total) by mouth  every 6 (six) hours as needed for nausea or vomiting. 30 tablet 0  . topiramate (TOPAMAX) 25 MG tablet Take 25 mg by mouth daily.     No current facility-administered medications for this visit.    Physical Exam BP 127/86   Pulse 94   Resp 20   Ht 5\' 5"  (1.651 m)   Wt 114 lb (51.7 kg)   LMP 04/30/2021   SpO2 95% Comment: RA  BMI 18.102 kg/m  45 year old woman in no acute distress Alert and oriented x3 with no focal deficits Lungs diminished at right base otherwise clear Incisions well-healed  Diagnostic Tests: I personally reviewed the chest x-ray images.  There is no change in the loculated pleural effusion that was present a month ago.  Impression: Kathleen Hughes is a 45 year old woman who is a lifelong non-smoker who was incidentally found to have a lung nodule earlier this year.  I did a robotic Right basilar segmentectomy and node dissection on 04/18/2021.  The nodule turned out to be a T1, N2, stage IIIa adenocarcinoma.  She is now about a month out from surgery.  She does still have some discomfort.  Her biggest complaint is sensitivity and altered sensation in the right upper quadrant region.  She does have some tenderness  around the anterior eighth interspace incision.  She still does not want to take anything stronger than Tylenol.  There are really no restrictions on her activities at this point.  She is tentatively scheduled to go to work on July 5.  She may need a longer if she starts chemotherapy between now and then.  Plan: Return in 3 months with PA lateral chest x-ray  Melrose Nakayama, MD [facility] [phone]

## 2021-05-23 ENCOUNTER — Telehealth: Payer: Self-pay | Admitting: Internal Medicine

## 2021-05-23 NOTE — Telephone Encounter (Signed)
Scheduled per los. Called and spoke with patient to go over all appts. Patient was under the impression that she was to start treatment on 6/21. Will speak with provider per patients request about postponing treatment start date and will call patient back.

## 2021-05-24 ENCOUNTER — Telehealth: Payer: Self-pay | Admitting: General Practice

## 2021-05-24 DIAGNOSIS — C3491 Malignant neoplasm of unspecified part of right bronchus or lung: Secondary | ICD-10-CM | POA: Diagnosis not present

## 2021-05-24 DIAGNOSIS — C3400 Malignant neoplasm of unspecified main bronchus: Secondary | ICD-10-CM | POA: Diagnosis not present

## 2021-05-24 NOTE — Telephone Encounter (Signed)
[facility] CSW Progress Notes  Follow up phone call to patient re her interest in Advance Directives.  Will mail her packet of information to review as well as information on how to sign up for [facility] to finalize this process.  She will call to schedule and/or with any questions.  Edwyna Shell, LCSW Clinical Social Worker Phone:  [phone]

## 2021-05-28 ENCOUNTER — Other Ambulatory Visit: Payer: BC Managed Care – PPO

## 2021-05-29 ENCOUNTER — Other Ambulatory Visit: Payer: BC Managed Care – PPO

## 2021-05-29 ENCOUNTER — Ambulatory Visit: Payer: BC Managed Care – PPO

## 2021-05-29 NOTE — Progress Notes (Signed)
Pharmacist Chemotherapy Monitoring - Initial Assessment    Anticipated start date: 06/05/21   Regimen:  Are orders appropriate based on the patient's diagnosis, regimen, and cycle? Yes Does the plan date match the patient's scheduled date? Yes Is the sequencing of drugs appropriate? Yes Are the premedications appropriate for the patient's regimen? Yes Prior Authorization for treatment is: Not Started If applicable, is the correct biosimilar selected based on the patient's insurance? not applicable  Organ Function and Labs: Are dose adjustments needed based on the patient's renal function, hepatic function, or hematologic function? Yes Are appropriate labs ordered prior to the start of patient's treatment? Yes Other organ system assessment, if indicated: N/A The following baseline labs, if indicated, have been ordered: N/A  Dose Assessment: Are the drug doses appropriate? Yes Are the following correct: Drug concentrations Yes IV fluid compatible with drug Yes Administration routes Yes Timing of therapy Yes If applicable, does the patient have documented access for treatment and/or plans for port-a-cath placement? no If applicable, have lifetime cumulative doses been properly documented and assessed? yes Lifetime Dose Tracking  No doses have been documented on this patient for the following tracked chemicals: Doxorubicin, Epirubicin, Idarubicin, Daunorubicin, Mitoxantrone, Bleomycin, Oxaliplatin, Carboplatin, Liposomal Doxorubicin    Toxicity Monitoring/Prevention: The patient has the following take home antiemetics prescribed: Prochlorperazine and Dexamethasone The patient has the following take home medications prescribed: B12 for pemetrexed Medication allergies and previous infusion related reactions, if applicable, have been reviewed and addressed. No The patient's current medication list has been assessed for drug-drug interactions with their chemotherapy regimen. no significant  drug-drug interactions were identified on review.  Order Review: Are the treatment plan orders signed? Yes Is the patient scheduled to see a provider prior to their treatment? No  I verify that I have reviewed each item in the above checklist and answered each question accordingly.  Larene Beach, RPH, 05/29/2021  11:53 AM

## 2021-05-31 ENCOUNTER — Encounter: Payer: Self-pay | Admitting: *Deleted

## 2021-05-31 ENCOUNTER — Telehealth: Payer: Self-pay | Admitting: Oncology

## 2021-05-31 NOTE — Progress Notes (Signed)
I received a message that patient has been seen at [facility] and would like to get her tx there. Oncology appts cancelled.

## 2021-05-31 NOTE — Telephone Encounter (Signed)
Contacted by the patient, Kathleen Hughes, that she has transferred her cancer care to [facility] and requested that all appointments for [facility] be cancelled at this time.  She expressed appreciation for those involved in her care at [facility].  This message is being routed to Dr. Julien Nordmann (and Abelina Bachelor and Norton Blizzard) for awareness, and to the scheduling team to cancel her appointments.

## 2021-06-04 ENCOUNTER — Inpatient Hospital Stay: Payer: BC Managed Care – PPO

## 2021-06-05 ENCOUNTER — Other Ambulatory Visit: Payer: BC Managed Care – PPO

## 2021-06-05 ENCOUNTER — Ambulatory Visit: Payer: BC Managed Care – PPO | Admitting: Physician Assistant

## 2021-06-05 ENCOUNTER — Inpatient Hospital Stay: Payer: BC Managed Care – PPO

## 2021-06-06 DIAGNOSIS — J90 Pleural effusion, not elsewhere classified: Secondary | ICD-10-CM | POA: Diagnosis not present

## 2021-06-06 DIAGNOSIS — C3491 Malignant neoplasm of unspecified part of right bronchus or lung: Secondary | ICD-10-CM | POA: Diagnosis not present

## 2021-06-07 DIAGNOSIS — F418 Other specified anxiety disorders: Secondary | ICD-10-CM | POA: Diagnosis not present

## 2021-06-07 DIAGNOSIS — C3431 Malignant neoplasm of lower lobe, right bronchus or lung: Secondary | ICD-10-CM | POA: Diagnosis not present

## 2021-06-07 DIAGNOSIS — Z5111 Encounter for antineoplastic chemotherapy: Secondary | ICD-10-CM | POA: Diagnosis not present

## 2021-06-07 DIAGNOSIS — T451X5A Adverse effect of antineoplastic and immunosuppressive drugs, initial encounter: Secondary | ICD-10-CM | POA: Diagnosis not present

## 2021-06-07 DIAGNOSIS — Z923 Personal history of irradiation: Secondary | ICD-10-CM | POA: Diagnosis not present

## 2021-06-07 DIAGNOSIS — R112 Nausea with vomiting, unspecified: Secondary | ICD-10-CM | POA: Diagnosis not present

## 2021-06-07 DIAGNOSIS — C3491 Malignant neoplasm of unspecified part of right bronchus or lung: Secondary | ICD-10-CM | POA: Diagnosis not present

## 2021-06-12 ENCOUNTER — Inpatient Hospital Stay: Payer: BC Managed Care – PPO

## 2021-06-12 ENCOUNTER — Inpatient Hospital Stay: Payer: BC Managed Care – PPO | Admitting: Physician Assistant

## 2021-06-12 ENCOUNTER — Other Ambulatory Visit: Payer: BC Managed Care – PPO

## 2021-06-19 ENCOUNTER — Other Ambulatory Visit: Payer: BC Managed Care – PPO

## 2021-06-20 ENCOUNTER — Other Ambulatory Visit: Payer: BC Managed Care – PPO

## 2021-06-20 ENCOUNTER — Ambulatory Visit: Payer: BC Managed Care – PPO

## 2021-06-20 ENCOUNTER — Ambulatory Visit: Payer: BC Managed Care – PPO | Admitting: Internal Medicine

## 2021-06-25 ENCOUNTER — Other Ambulatory Visit: Payer: BC Managed Care – PPO

## 2021-06-25 ENCOUNTER — Encounter: Payer: BC Managed Care – PPO | Admitting: Nutrition

## 2021-06-25 ENCOUNTER — Ambulatory Visit: Payer: BC Managed Care – PPO

## 2021-06-25 ENCOUNTER — Ambulatory Visit: Payer: BC Managed Care – PPO | Admitting: Internal Medicine

## 2021-06-26 ENCOUNTER — Other Ambulatory Visit: Payer: BC Managed Care – PPO

## 2021-06-28 DIAGNOSIS — D702 Other drug-induced agranulocytosis: Secondary | ICD-10-CM | POA: Diagnosis not present

## 2021-06-28 DIAGNOSIS — Z5111 Encounter for antineoplastic chemotherapy: Secondary | ICD-10-CM | POA: Diagnosis not present

## 2021-06-28 DIAGNOSIS — C3491 Malignant neoplasm of unspecified part of right bronchus or lung: Secondary | ICD-10-CM | POA: Diagnosis not present

## 2021-07-03 ENCOUNTER — Other Ambulatory Visit: Payer: BC Managed Care – PPO

## 2021-07-03 DIAGNOSIS — C3491 Malignant neoplasm of unspecified part of right bronchus or lung: Secondary | ICD-10-CM | POA: Diagnosis not present

## 2021-07-03 DIAGNOSIS — H9319 Tinnitus, unspecified ear: Secondary | ICD-10-CM | POA: Diagnosis not present

## 2021-07-03 DIAGNOSIS — F419 Anxiety disorder, unspecified: Secondary | ICD-10-CM | POA: Diagnosis not present

## 2021-07-03 DIAGNOSIS — Z5111 Encounter for antineoplastic chemotherapy: Secondary | ICD-10-CM | POA: Diagnosis not present

## 2021-07-03 DIAGNOSIS — Z79899 Other long term (current) drug therapy: Secondary | ICD-10-CM | POA: Diagnosis not present

## 2021-07-03 DIAGNOSIS — R110 Nausea: Secondary | ICD-10-CM | POA: Diagnosis not present

## 2021-07-03 DIAGNOSIS — C3431 Malignant neoplasm of lower lobe, right bronchus or lung: Secondary | ICD-10-CM | POA: Diagnosis not present

## 2021-07-03 DIAGNOSIS — G8918 Other acute postprocedural pain: Secondary | ICD-10-CM | POA: Diagnosis not present

## 2021-07-03 DIAGNOSIS — T451X5A Adverse effect of antineoplastic and immunosuppressive drugs, initial encounter: Secondary | ICD-10-CM | POA: Diagnosis not present

## 2021-07-03 DIAGNOSIS — R112 Nausea with vomiting, unspecified: Secondary | ICD-10-CM | POA: Diagnosis not present

## 2021-07-03 DIAGNOSIS — D702 Other drug-induced agranulocytosis: Secondary | ICD-10-CM | POA: Diagnosis not present

## 2021-07-10 ENCOUNTER — Ambulatory Visit: Payer: BC Managed Care – PPO

## 2021-07-10 ENCOUNTER — Other Ambulatory Visit: Payer: BC Managed Care – PPO

## 2021-07-10 ENCOUNTER — Ambulatory Visit: Payer: BC Managed Care – PPO | Admitting: Internal Medicine

## 2021-07-16 ENCOUNTER — Other Ambulatory Visit: Payer: BC Managed Care – PPO

## 2021-07-16 ENCOUNTER — Ambulatory Visit: Payer: BC Managed Care – PPO

## 2021-07-16 ENCOUNTER — Ambulatory Visit: Payer: BC Managed Care – PPO | Admitting: Internal Medicine

## 2021-07-17 ENCOUNTER — Other Ambulatory Visit: Payer: BC Managed Care – PPO

## 2021-07-23 DIAGNOSIS — D702 Other drug-induced agranulocytosis: Secondary | ICD-10-CM | POA: Diagnosis not present

## 2021-07-23 DIAGNOSIS — F418 Other specified anxiety disorders: Secondary | ICD-10-CM | POA: Diagnosis not present

## 2021-07-23 DIAGNOSIS — F5101 Primary insomnia: Secondary | ICD-10-CM | POA: Diagnosis not present

## 2021-07-23 DIAGNOSIS — C3491 Malignant neoplasm of unspecified part of right bronchus or lung: Secondary | ICD-10-CM | POA: Diagnosis not present

## 2021-07-23 DIAGNOSIS — G4700 Insomnia, unspecified: Secondary | ICD-10-CM | POA: Diagnosis not present

## 2021-07-23 DIAGNOSIS — G8918 Other acute postprocedural pain: Secondary | ICD-10-CM | POA: Diagnosis not present

## 2021-07-23 DIAGNOSIS — Z5111 Encounter for antineoplastic chemotherapy: Secondary | ICD-10-CM | POA: Diagnosis not present

## 2021-07-23 DIAGNOSIS — T451X5A Adverse effect of antineoplastic and immunosuppressive drugs, initial encounter: Secondary | ICD-10-CM | POA: Diagnosis not present

## 2021-07-23 DIAGNOSIS — R112 Nausea with vomiting, unspecified: Secondary | ICD-10-CM | POA: Diagnosis not present

## 2021-07-24 ENCOUNTER — Other Ambulatory Visit: Payer: BC Managed Care – PPO

## 2021-07-30 DIAGNOSIS — G4700 Insomnia, unspecified: Secondary | ICD-10-CM | POA: Diagnosis not present

## 2021-07-30 DIAGNOSIS — D702 Other drug-induced agranulocytosis: Secondary | ICD-10-CM | POA: Diagnosis not present

## 2021-07-30 DIAGNOSIS — C3431 Malignant neoplasm of lower lobe, right bronchus or lung: Secondary | ICD-10-CM | POA: Diagnosis not present

## 2021-07-30 DIAGNOSIS — Z79899 Other long term (current) drug therapy: Secondary | ICD-10-CM | POA: Diagnosis not present

## 2021-07-30 DIAGNOSIS — Z5112 Encounter for antineoplastic immunotherapy: Secondary | ICD-10-CM | POA: Diagnosis not present

## 2021-07-30 DIAGNOSIS — T451X5A Adverse effect of antineoplastic and immunosuppressive drugs, initial encounter: Secondary | ICD-10-CM | POA: Diagnosis not present

## 2021-07-30 DIAGNOSIS — Z7952 Long term (current) use of systemic steroids: Secondary | ICD-10-CM | POA: Diagnosis not present

## 2021-07-30 DIAGNOSIS — H5710 Ocular pain, unspecified eye: Secondary | ICD-10-CM | POA: Diagnosis not present

## 2021-07-30 DIAGNOSIS — R112 Nausea with vomiting, unspecified: Secondary | ICD-10-CM | POA: Diagnosis not present

## 2021-07-30 DIAGNOSIS — C3491 Malignant neoplasm of unspecified part of right bronchus or lung: Secondary | ICD-10-CM | POA: Diagnosis not present

## 2021-07-30 DIAGNOSIS — G8918 Other acute postprocedural pain: Secondary | ICD-10-CM | POA: Diagnosis not present

## 2021-07-30 DIAGNOSIS — Z5111 Encounter for antineoplastic chemotherapy: Secondary | ICD-10-CM | POA: Diagnosis not present

## 2021-07-30 DIAGNOSIS — F418 Other specified anxiety disorders: Secondary | ICD-10-CM | POA: Diagnosis not present

## 2021-07-30 DIAGNOSIS — F419 Anxiety disorder, unspecified: Secondary | ICD-10-CM | POA: Diagnosis not present

## 2021-07-31 ENCOUNTER — Other Ambulatory Visit: Payer: BC Managed Care – PPO

## 2021-07-31 ENCOUNTER — Ambulatory Visit: Payer: BC Managed Care – PPO | Admitting: Internal Medicine

## 2021-07-31 ENCOUNTER — Ambulatory Visit: Payer: BC Managed Care – PPO

## 2021-08-06 ENCOUNTER — Ambulatory Visit: Payer: BC Managed Care – PPO | Admitting: Internal Medicine

## 2021-08-06 ENCOUNTER — Ambulatory Visit: Payer: BC Managed Care – PPO

## 2021-08-06 ENCOUNTER — Other Ambulatory Visit: Payer: BC Managed Care – PPO

## 2021-08-07 ENCOUNTER — Other Ambulatory Visit: Payer: Self-pay | Admitting: Obstetrics & Gynecology

## 2021-08-07 DIAGNOSIS — Z1231 Encounter for screening mammogram for malignant neoplasm of breast: Secondary | ICD-10-CM

## 2021-08-16 ENCOUNTER — Other Ambulatory Visit: Payer: Self-pay | Admitting: Internal Medicine

## 2021-08-23 DIAGNOSIS — Z7952 Long term (current) use of systemic steroids: Secondary | ICD-10-CM | POA: Diagnosis not present

## 2021-08-23 DIAGNOSIS — D702 Other drug-induced agranulocytosis: Secondary | ICD-10-CM | POA: Diagnosis not present

## 2021-08-23 DIAGNOSIS — Z79899 Other long term (current) drug therapy: Secondary | ICD-10-CM | POA: Diagnosis not present

## 2021-08-23 DIAGNOSIS — G8918 Other acute postprocedural pain: Secondary | ICD-10-CM | POA: Diagnosis not present

## 2021-08-23 DIAGNOSIS — F418 Other specified anxiety disorders: Secondary | ICD-10-CM | POA: Diagnosis not present

## 2021-08-23 DIAGNOSIS — C3431 Malignant neoplasm of lower lobe, right bronchus or lung: Secondary | ICD-10-CM | POA: Diagnosis not present

## 2021-08-23 DIAGNOSIS — R110 Nausea: Secondary | ICD-10-CM | POA: Diagnosis not present

## 2021-08-23 DIAGNOSIS — C3491 Malignant neoplasm of unspecified part of right bronchus or lung: Secondary | ICD-10-CM | POA: Diagnosis not present

## 2021-08-23 DIAGNOSIS — R112 Nausea with vomiting, unspecified: Secondary | ICD-10-CM | POA: Diagnosis not present

## 2021-08-23 DIAGNOSIS — Z5111 Encounter for antineoplastic chemotherapy: Secondary | ICD-10-CM | POA: Diagnosis not present

## 2021-08-23 DIAGNOSIS — G4700 Insomnia, unspecified: Secondary | ICD-10-CM | POA: Diagnosis not present

## 2021-08-23 DIAGNOSIS — F419 Anxiety disorder, unspecified: Secondary | ICD-10-CM | POA: Diagnosis not present

## 2021-08-23 DIAGNOSIS — T451X5A Adverse effect of antineoplastic and immunosuppressive drugs, initial encounter: Secondary | ICD-10-CM | POA: Diagnosis not present

## 2021-08-27 ENCOUNTER — Other Ambulatory Visit: Payer: Self-pay | Admitting: Thoracic Surgery (Cardiothoracic Vascular Surgery)

## 2021-08-27 DIAGNOSIS — C3491 Malignant neoplasm of unspecified part of right bronchus or lung: Secondary | ICD-10-CM

## 2021-08-28 ENCOUNTER — Other Ambulatory Visit: Payer: Self-pay

## 2021-08-28 ENCOUNTER — Ambulatory Visit
Admission: RE | Admit: 2021-08-28 | Discharge: 2021-08-28 | Disposition: A | Discharge status: Home / Self Care | Payer: BC Managed Care – PPO | Source: Ambulatory Visit | Attending: Thoracic Surgery (Cardiothoracic Vascular Surgery) | Admitting: Thoracic Surgery (Cardiothoracic Vascular Surgery)

## 2021-08-28 ENCOUNTER — Ambulatory Visit (INDEPENDENT_AMBULATORY_CARE_PROVIDER_SITE_OTHER): Payer: BC Managed Care – PPO | Admitting: Thoracic Surgery (Cardiothoracic Vascular Surgery)

## 2021-08-28 VITALS — BP 114/75 | HR 100 | Resp 20 | Ht 65.0 in | Wt 115.0 lb

## 2021-08-28 DIAGNOSIS — Z9889 Other specified postprocedural states: Secondary | ICD-10-CM

## 2021-08-28 DIAGNOSIS — C3491 Malignant neoplasm of unspecified part of right bronchus or lung: Secondary | ICD-10-CM

## 2021-08-28 DIAGNOSIS — J90 Pleural effusion, not elsewhere classified: Secondary | ICD-10-CM | POA: Diagnosis not present

## 2021-08-28 DIAGNOSIS — J984 Other disorders of lung: Secondary | ICD-10-CM | POA: Diagnosis not present

## 2021-08-28 NOTE — Progress Notes (Signed)
[address]       [place] [address]             [phone]      HPI: Kathleen Hughes returns for follow-up after her lung resection.  Kathleen Hughes is a 45 year old woman who had a right lower lobe basilar segmentectomy on 04/18/2021 for a clinical stage Ia (T1, N0), pathologic stage IIIa (T1, N2) adenocarcinoma.  Her nodules initially found incidentally.  She is a lifelong non-smoker.  She was having fair amount of incisional pain last time I saw her in June.  She also was complaining of a frequent cough.  She was not taking any narcotics but only Tylenol for the pain.  She did not want anything stronger.  In the interim since her last visit she has undergone chemotherapy.  She is doing that with Dr. Durenda Hughes at [facility].  She was having a lot of nausea initially.  That has improved to some degree.  She does complain of fatigue and not having any energy.  She continues to have pain along the right costal margin and in the right upper quadrant.  She says it varies from day-to-day being as low as a 2 and is high as a 6.  She takes an occasional Tylenol but otherwise is not using any medication for that.  Past Medical History:  Diagnosis Date   Atypical mole 01/19/2014   mild right breast   Basal cell carcinoma 01/17/2009   nose bowens  tx-aldara   Basal cell carcinoma of skin 03/13/2021   nod-left breast (MOHS)   BCC (basal cell carcinoma of skin) 07/08/2013   chest tx-cx3 32fu   Breast mass, right 08/24/2013   1 at 12-1 o'clock and 1 at 3 o'clock will get mammogram and [place] at breast center   Contraceptive management 02/15/2015   Migraines    PONV (postoperative nausea and vomiting)    Recurrent cold sores    UTI (lower urinary tract infection) 02/15/2015    Current Outpatient Medications  Medication Sig Dispense Refill   acetaminophen (TYLENOL) 500 MG tablet Take 2 tablets (1,000 mg total) by mouth every 6 (six) hours as needed. 30 tablet 0   dexamethasone  (DECADRON) 4 MG tablet 4 mg p.o. twice daily the day before, day of and day after chemotherapy every 3 weeks 40 tablet 0   folic acid (FOLVITE) 1 MG tablet Take 1 tablet (1 mg total) by mouth daily. 30 tablet 4   NURTEC 75 MG TBDP Take 1 tablet by mouth daily as needed.     prochlorperazine (COMPAZINE) 10 MG tablet Take 1 tablet (10 mg total) by mouth every 6 (six) hours as needed for nausea or vomiting. 30 tablet 0   topiramate (TOPAMAX) 25 MG tablet Take 25 mg by mouth daily.     No current facility-administered medications for this visit.    Physical Exam BP 114/75   Pulse 100   Resp 20   Ht 5\' 5"  (1.651 m)   Wt 115 lb (52.2 kg)   SpO2 98% Comment: RA  BMI 19.14 kg/m  Well-appearing 45 year old woman in no acute distress Alert and oriented x3 with no focal deficits Lungs diminished at right base, otherwise clear Cardiac regular rate and rhythm Incisions well-healed  Diagnostic Tests: I personally reviewed her chest x-ray.  It shows evolution of postoperative changes with some improvement in the amount of pleural effusion compared to her last film.  Impression: Kathleen Hughes is a  45 year old woman who is a lifelong non-smoker who was incidentally found to have a lung nodule.  That turned out to be an adenocarcinoma.  She was clinical stage Ia, but upgraded to pathologic stage IIIa due to metastasis to a level 8 lymph node.  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  Common finding after surgery.  Should continue to improve with time.  Offered again to consider Neurontin or Lyrica.  She has had a lot of side effects with different medications and wants to avoid medications if possible.  She has now had her fourth cycle of adjuvant chemotherapy with cisplatin and pemetrexed.  She is doing her chemotherapy and her follow-up at [facility].  I will defer to them on scans.  Plan: Return in 4 months with PA lateral chest  x-ray to check on progress.  Melrose Nakayama, MD [facility] [phone]

## 2021-08-29 ENCOUNTER — Ambulatory Visit
Admission: RE | Admit: 2021-08-29 | Discharge: 2021-08-29 | Disposition: A | Discharge status: Home / Self Care | Payer: BC Managed Care – PPO | Source: Ambulatory Visit | Attending: Obstetrics & Gynecology | Admitting: Obstetrics & Gynecology

## 2021-08-29 DIAGNOSIS — Z1231 Encounter for screening mammogram for malignant neoplasm of breast: Secondary | ICD-10-CM

## 2021-09-25 DIAGNOSIS — C3491 Malignant neoplasm of unspecified part of right bronchus or lung: Secondary | ICD-10-CM | POA: Diagnosis not present

## 2021-09-25 DIAGNOSIS — Z5111 Encounter for antineoplastic chemotherapy: Secondary | ICD-10-CM | POA: Diagnosis not present

## 2021-10-08 DIAGNOSIS — C3491 Malignant neoplasm of unspecified part of right bronchus or lung: Secondary | ICD-10-CM | POA: Diagnosis not present

## 2021-10-08 DIAGNOSIS — J984 Other disorders of lung: Secondary | ICD-10-CM | POA: Diagnosis not present

## 2021-10-15 DIAGNOSIS — B001 Herpesviral vesicular dermatitis: Secondary | ICD-10-CM | POA: Diagnosis not present

## 2021-10-19 ENCOUNTER — Other Ambulatory Visit: Payer: Self-pay | Admitting: Adult Health

## 2021-11-01 DIAGNOSIS — Z23 Encounter for immunization: Secondary | ICD-10-CM | POA: Diagnosis not present

## 2021-11-01 DIAGNOSIS — Z6832 Body mass index (BMI) 32.0-32.9, adult: Secondary | ICD-10-CM | POA: Diagnosis not present

## 2021-11-07 ENCOUNTER — Other Ambulatory Visit: Payer: Self-pay

## 2021-11-07 ENCOUNTER — Encounter: Payer: Self-pay | Admitting: Adult Health

## 2021-11-07 ENCOUNTER — Ambulatory Visit (INDEPENDENT_AMBULATORY_CARE_PROVIDER_SITE_OTHER): Payer: BC Managed Care – PPO | Admitting: Adult Health

## 2021-11-07 VITALS — BP 108/71 | HR 87 | Ht 65.0 in | Wt 127.0 lb

## 2021-11-07 DIAGNOSIS — Z1211 Encounter for screening for malignant neoplasm of colon: Secondary | ICD-10-CM

## 2021-11-07 DIAGNOSIS — Z1212 Encounter for screening for malignant neoplasm of rectum: Secondary | ICD-10-CM

## 2021-11-07 DIAGNOSIS — C3491 Malignant neoplasm of unspecified part of right bronchus or lung: Secondary | ICD-10-CM

## 2021-11-07 DIAGNOSIS — Z01419 Encounter for gynecological examination (general) (routine) without abnormal findings: Secondary | ICD-10-CM

## 2021-11-07 DIAGNOSIS — Z3041 Encounter for surveillance of contraceptive pills: Secondary | ICD-10-CM

## 2021-11-07 LAB — HEMOCCULT GUIAC POC 1CARD (OFFICE): Fecal Occult Blood, POC: NEGATIVE

## 2021-11-07 MED ORDER — NORETHINDRONE 0.35 MG PO TABS: 1.0000 | ORAL_TABLET | Freq: Every day | ORAL | 4 refills | Status: DC

## 2021-11-07 NOTE — Progress Notes (Signed)
Patient ID: Kathleen Hughes, female   DOB: 06/28/1976, 45 y.o.   MRN: [id number] History of Present Illness: Kathleen Hughes is a 45 year old white female, married, G1P1 in for a well woman gyn exam. She had right lobectomy in May, for stage 3 adenocarcinoma of lung. She had had CT in December for ?kidney stone and had nodule in right lung, and had follow up CT in March and it had increased in size by 33%. She had chemo at [facility] x 4 treatments. She is teary, but looks great, she is working but stamina not good.  PCP is Dr Kathleen Hughes.   Lab Results  Component Value Date   DIAGPAP  09/26/2020    - Negative for intraepithelial lesion or malignancy (NILM)   HPV NOT DETECTED 03/24/2017   [place] Negative 09/26/2020    Current Medications, Allergies, Past Medical History, Past Surgical History, Family History and Social History were reviewed in [employer] record.     Review of Systems: Patient denies any headaches, hearing loss, blurred vision, shortness of breath, chest pain, abdominal pain, problems with bowel movements, urination, or intercourse. No joint pain or mood swings.  Decrease stamina No periods on POP.    Physical Exam:BP 108/71 (BP Location: Right Arm, Patient Position: Sitting, Cuff Size: Normal)   Pulse 87   Ht 5\' 5"  (1.651 m)   Wt 127 lb (57.6 kg)   LMP  (LMP Unknown)   BMI 21.13 kg/m   General:  Well developed, well nourished, no acute distress Skin:  Warm and dry Neck:  Midline trachea, normal thyroid, good ROM, no lymphadenopathy Lungs; Clear to auscultation bilaterally Breast:  No dominant palpable mass, retraction, or nipple discharge Cardiovascular: Regular rate and rhythm Abdomen:  Soft, non tender, no hepatosplenomegaly Pelvic:  External genitalia is normal in appearance, no lesions.  The vagina is normal in appearance. Urethra has no lesions or masses. The cervix is smooth.  Uterus is felt to be normal size, shape, and contour.  No adnexal masses or  tenderness noted.Bladder is non tender, no masses felt. Rectal: Good sphincter tone, no polyps, or hemorrhoids felt.  Hemoccult negative. Extremities/musculoskeletal:  No swelling or varicosities noted, no clubbing or cyanosis Psych:  No mood changes, alert and cooperative,seems happy AA is 0 Fall risk is low Depression screen [facility] 2/9 11/07/2021 09/26/2020 09/07/2018  Decreased Interest 0 0 0  Down, Depressed, Hopeless 0 0 0  PHQ - 2 Score 0 0 0  Altered sleeping 0 1 -  Tired, decreased energy 0 0 -  Change in appetite 0 1 -  Feeling bad or failure about yourself  0 0 -  Trouble concentrating 0 0 -  Moving slowly or fidgety/restless 0 0 -  Suicidal thoughts 0 0 -  PHQ-9 Score 0 2 -    GAD 7 : Generalized Anxiety Score 11/07/2021 09/26/2020  Nervous, Anxious, on Edge 0 0  Control/stop worrying 0 1  Worry too much - different things 0 0  Trouble relaxing 0 1  Restless 0 0  Easily annoyed or irritable 0 0  Afraid - awful might happen 0 1  Total GAD 7 Score 0 3      Upstream - 11/07/21 1409       Pregnancy Intention Screening   Does the patient want to become pregnant in the next year? No    Does the patient's partner want to become pregnant in the next year? No    Would the patient like to  discuss contraceptive options today? No      Contraception Wrap Up   Current Method Oral Contraceptive    End Method Oral Contraceptive    Contraception Counseling Provided No             Examination chaperoned by Kathleen Luz, NP student   Impression and Plan: 1. Encounter for well woman exam with routine gynecological exam Physical in 1 year Pap in 2024 Mammogram yearly  2. Encounter for surveillance of contraceptive pills Will refill Kathleen Hughes  Meds ordered this encounter  Medications   norethindrone (Kathleen Hughes) 0.35 MG tablet    Sig: Take 1 tablet (0.35 mg total) by mouth daily.    Dispense:  84 tablet    Refill:  4    Order Specific Question:   Supervising Provider     Answer:   Kathleen Hughes, Kathleen Hughes [[id number]]     3. Encounter for screening fecal occult blood testing  4. Screening for colorectal cancer Referred to Dr Kathleen Hughes at [facility] for colonoscopy  - Ambulatory referral to Gastroenterology  5. Adenocarcinoma of right lung, stage 3 (Kathleen Hughes) Follow up at [facility]

## 2021-11-15 ENCOUNTER — Encounter: Payer: Self-pay | Admitting: Internal Medicine

## 2021-12-30 DIAGNOSIS — N760 Acute vaginitis: Secondary | ICD-10-CM | POA: Diagnosis not present

## 2021-12-31 ENCOUNTER — Other Ambulatory Visit: Payer: Self-pay | Admitting: Thoracic Surgery (Cardiothoracic Vascular Surgery)

## 2021-12-31 DIAGNOSIS — C3491 Malignant neoplasm of unspecified part of right bronchus or lung: Secondary | ICD-10-CM

## 2022-01-01 ENCOUNTER — Ambulatory Visit: Payer: BC Managed Care – PPO | Admitting: Thoracic Surgery (Cardiothoracic Vascular Surgery)

## 2022-01-01 ENCOUNTER — Other Ambulatory Visit: Payer: Self-pay

## 2022-01-01 ENCOUNTER — Ambulatory Visit
Admission: RE | Admit: 2022-01-01 | Discharge: 2022-01-01 | Disposition: A | Discharge status: Home / Self Care | Payer: BC Managed Care – PPO | Source: Ambulatory Visit | Attending: Thoracic Surgery (Cardiothoracic Vascular Surgery) | Admitting: Thoracic Surgery (Cardiothoracic Vascular Surgery)

## 2022-01-01 VITALS — BP 127/82 | HR 80 | Resp 20 | Ht 65.0 in | Wt 129.0 lb

## 2022-01-01 DIAGNOSIS — C3491 Malignant neoplasm of unspecified part of right bronchus or lung: Secondary | ICD-10-CM

## 2022-01-01 DIAGNOSIS — J90 Pleural effusion, not elsewhere classified: Secondary | ICD-10-CM | POA: Diagnosis not present

## 2022-01-01 DIAGNOSIS — J9811 Atelectasis: Secondary | ICD-10-CM | POA: Diagnosis not present

## 2022-01-01 DIAGNOSIS — C3490 Malignant neoplasm of unspecified part of unspecified bronchus or lung: Secondary | ICD-10-CM | POA: Diagnosis not present

## 2022-01-01 NOTE — Progress Notes (Signed)
[address]       [place] [address]             [phone]     HPI: [name] returns for a scheduled follow-up visit after previous lung resection  Kathleen Hughes is a 46 year old woman who is a lifelong non-smoker.  She was incidentally found to have a lung nodule.  She had a robotic right lower lobe basilar segmentectomy and node dissection in May 2022.  Clinically she had stage Ia disease but pathologically she had a T1, N2, stage IIIa tumor.  I last saw her in September 2022.  She was still having a lot of intercostal neuralgia pain at that time.  She had chemotherapy with Dr. Durenda Hurt at [facility].  She last saw him and had a CT scan in October.  There was no evidence of recurrent disease.  In the interim since her last visit she continues to have some pain although it has improved.  She is does still feel occasional pinching sensation.  Her biggest complaint is she has trouble sleeping at night with her mind racing.  She is not having any respiratory issues.  Past Medical History:  Diagnosis Date   Atypical mole 01/19/2014   mild right breast   Basal cell carcinoma 01/17/2009   nose bowens  tx-aldara   Basal cell carcinoma of skin 03/13/2021   nod-left breast ([facility])   BCC (basal cell carcinoma of skin) 07/08/2013   chest tx-cx3 38fu   Breast mass, right 08/24/2013   1 at 12-1 o'clock and 1 at 3 o'clock will get mammogram and [place] at breast center   Contraceptive management 02/15/2015   Lung cancer ([place]) 04/2021   Stage 3   Migraines    PONV (postoperative nausea and vomiting)    Recurrent cold sores    UTI (lower urinary tract infection) 02/15/2015    Current Outpatient Medications  Medication Sig Dispense Refill   hydrOXYzine (ATARAX/VISTARIL) 25 MG tablet Take 25 mg by mouth at bedtime as needed.     norethindrone ([name]) 0.35 MG tablet Take 1 tablet (0.35 mg total) by mouth daily. 84 tablet 4   NURTEC 75 MG TBDP Take 1 tablet by mouth  daily as needed.     topiramate (TOPAMAX) 25 MG tablet Take 25 mg by mouth daily.     acetaminophen (TYLENOL) 500 MG tablet Take 2 tablets (1,000 mg total) by mouth every 6 (six) hours as needed. (Patient not taking: Reported on 01/01/2022) 30 tablet 0   folic acid (FOLVITE) 1 MG tablet Take 1 tablet (1 mg total) by mouth daily. (Patient not taking: Reported on 01/01/2022) 30 tablet 4   No current facility-administered medications for this visit.    Physical Exam BP 127/82 (BP Location: Right Arm, Patient Position: Sitting)    Pulse 80    Resp 20    Ht 5\' 5"  (1.651 m)    Wt 129 lb (58.5 kg)    SpO2 98% Comment: RA   BMI 21.65 kg/m  46 year old woman in no acute distress Alert and oriented x3 with no focal deficits Lungs clear with equal breath sounds bilaterally Cardiac regular rate and rhythm  Diagnostic Tests: CHEST - 2 VIEW   COMPARISON:  08/28/2021   FINDINGS: Persistent right basilar atelectasis/scarring and pleural effusion. Aeration is improved compared to the prior study. Left lung is clear. Normal heart size. No acute osseous abnormality.   IMPRESSION: Persistent but decreased right basilar atelectasis/scarring and  pleural effusion.     Electronically Signed   By: Macy Mis M.D.   On: 01/01/2022 13:08 I personally reviewed her chest x-ray.  Shows postoperative changes.  No concerning findings.  Impression: Kathleen Hughes is a 46 year old woman who is a lifelong non-smoker who was incidentally found to have a lung nodule.  I did a basilar segmentectomy and that turned out to be a non-small cell carcinoma.  Surprisingly, there was a positive level 8 node so it was a T1, N2, stage IIIa lesion.  She received adjuvant chemotherapy at [facility].  She continues to have some intercostal neuralgia discomfort.  That continues to improve slowly but has not ever resolved completely.  She may always have that to some degree.  She has not wanted to try medications or nerve blocks in  the past and still does not feel like those are necessary.     Plan: Continue to follow-up with Dr. Durenda Hurt at [facility].   I will be happy to see her back anytime in the future if I can be of any further assistance with her care  Melrose Nakayama, MD [facility] [phone]

## 2022-01-02 ENCOUNTER — Ambulatory Visit (INDEPENDENT_AMBULATORY_CARE_PROVIDER_SITE_OTHER): Payer: Self-pay | Admitting: *Deleted

## 2022-01-02 VITALS — Ht 65.0 in | Wt 127.0 lb

## 2022-01-02 DIAGNOSIS — Z1211 Encounter for screening for malignant neoplasm of colon: Secondary | ICD-10-CM

## 2022-01-02 NOTE — Progress Notes (Addendum)
Gastroenterology Pre-Procedure Review  Request Date: 01/02/2022 Requesting Physician: Derrek Monaco, NP@ [facility], no previous TCS  PATIENT REVIEW QUESTIONS: The patient responded to the following health history questions as indicated:    1. Diabetes Melitis: no 2. Joint replacements in the past 12 months: no 3. Major health problems in the past 3 months: no, Last CT scan OCT 2022-no concerns for lung cancer, pt was formerly diagnosed with lung cancer May 2022 4. Has an artificial valve or MVP: no 5. Has a defibrillator: no 6. Has been advised in past to take antibiotics in advance of a procedure like teeth cleaning: no 7. Family history of colon cancer: no 8. Alcohol Use: no 9. Illicit drug Use: no 10. History of sleep apnea: no 11. History of coronary artery or other vascular stents placed within the last 12 months: no 12. History of any prior anesthesia complications: yes, nausea 2003 c-section 13. Body mass index is 21.13 kg/m.    MEDICATIONS & ALLERGIES:    Patient reports the following regarding taking any blood thinners:   Plavix? no Aspirin? no Coumadin? no Brilinta? no Xarelto? no Eliquis? no Pradaxa? no Savaysa? no Effient? no  Patient confirms/reports the following medications:  Current Outpatient Medications  Medication Sig Dispense Refill   hydrOXYzine (ATARAX/VISTARIL) 25 MG tablet Take 25 mg by mouth at bedtime as needed.     norethindrone ([name]) 0.35 MG tablet Take 1 tablet (0.35 mg total) by mouth daily. 84 tablet 4   NURTEC 75 MG TBDP Take 1 tablet by mouth daily as needed.     topiramate (TOPAMAX) 25 MG tablet Take 25 mg by mouth daily.     No current facility-administered medications for this visit.    Patient confirms/reports the following allergies:  Allergies  Allergen Reactions   Codone [Hydrocodone] Nausea Only    "I do not tolerate pain medication"    No orders of the defined types were placed in this  encounter.   AUTHORIZATION INFORMATION Primary Insurance: [place],  [place] #: [id number],  Group #: [id number] Pre-Cert / Josem Kaufmann required: No, not required  SCHEDULE INFORMATION: Procedure has been scheduled as follows:  Date: 02/22/2022, Time:  9:00 Location: [facility] with Dr. Abbey Chatters  This Gastroenterology Pre-Precedure Review Form is being routed to the following provider(s): Roseanne Kaufman, NP

## 2022-01-16 NOTE — Progress Notes (Signed)
Asa 2. Appropriate.

## 2022-01-16 NOTE — Progress Notes (Signed)
Spoke with pt.  She requested to wait until March for procedure.  She would like Mar. 10 early morning if possible.  Informed her that I would call her once procedure schedules available.

## 2022-01-23 ENCOUNTER — Other Ambulatory Visit: Payer: Self-pay | Admitting: *Deleted

## 2022-01-23 ENCOUNTER — Encounter: Payer: Self-pay | Admitting: *Deleted

## 2022-01-23 MED ORDER — NA SULFATE-K SULFATE-MG SULF 17.5-3.13-1.6 GM/177ML PO SOLN: 1.0000 | Freq: Once | ORAL | 0 refills | Status: AC

## 2022-01-23 NOTE — Progress Notes (Signed)
Roseanne Kaufman, NP:  Does pt need to hold Topirmate?

## 2022-01-23 NOTE — Progress Notes (Signed)
Called and spoke to [place] at [facility].  He informed me that there is no age criteria for colonoscopies.  REF#: [id number]

## 2022-01-23 NOTE — Progress Notes (Signed)
Pt informed to hold topiramate x 7 days prior to procedure.  Mailed letter along with instructions.

## 2022-01-23 NOTE — Progress Notes (Signed)
Hold topiramate X 7 days prior. Thanks!

## 2022-01-23 NOTE — Progress Notes (Signed)
Spoke to pt.  Scheduled procedure for 02/22/2022 as requested by pt.  She is aware to arrive at 7:30 at [facility].  Reviewed prep instructions with pt by phone.  She was made aware that she needs preg test done on 02/20/2022 between 8:30-4:00.  Confirmed mailing address with pt to send her prep instructions.

## 2022-01-23 NOTE — Addendum Note (Signed)
Addended by: Metro Kung on: 01/23/2022 01:01 PM   Modules accepted: Orders

## 2022-01-31 DIAGNOSIS — Z6822 Body mass index (BMI) 22.0-22.9, adult: Secondary | ICD-10-CM | POA: Diagnosis not present

## 2022-01-31 DIAGNOSIS — R059 Cough, unspecified: Secondary | ICD-10-CM | POA: Diagnosis not present

## 2022-01-31 DIAGNOSIS — J0101 Acute recurrent maxillary sinusitis: Secondary | ICD-10-CM | POA: Diagnosis not present

## 2022-02-15 NOTE — Patient Instructions (Signed)
? ? ? ? ? ? Kathleen Hughes ? 02/15/2022  ?  ? @PREFPERIOPPHARMACY @ ? ? Your procedure is scheduled on  02/22/2022. ? ? Report to [name] at  0700  A.M. ? ? Call this number if you have problems the morning of surgery: ? [phone] ? ? Remember: ? Follow the diet and prep instructions given to you by the office. ?  ? Take these medicines the morning of surgery with A SIP OF WATER  ? ?                           nurtec(if needed), topamax. ?  ? ? Do not wear jewelry, make-up or nail polish. ? Do not wear lotions, powders, or perfumes, or deodorant. ? Do not shave 48 hours prior to surgery.  Men may shave face and neck. ? Do not bring valuables to the hospital. ? [name] is not responsible for any belongings or valuables. ? ?Contacts, dentures or bridgework may not be worn into surgery.  Leave your suitcase in the car.  After surgery it may be brought to your room. ? ?For patients admitted to the hospital, discharge time will be determined by your treatment team. ? ?Patients discharged the day of surgery will not be allowed to drive home and must have someone with them for 24 hours.  ? ? ?Special instructions:   DO NOT smoke tobacco or vape for 24 hours before your procedure. ? ?Please read over the following fact sheets that you were given. ?Anesthesia Post-op Instructions and Care and Recovery After Surgery ?  ? ? ? Colonoscopy, Adult, Care After ?This sheet gives you information about how to care for yourself after your procedure. Your health care provider may also give you more specific instructions. If you have problems or questions, contact your health care provider. ?What can I expect after the procedure? ?After the procedure, it is common to have: ?A small amount of blood in your stool for 24 hours after the procedure. ?Some gas. ?Mild cramping or bloating of your abdomen. ?Follow these instructions at home: ?Eating and drinking ? ?Drink enough fluid to keep your urine pale yellow. ?Follow instructions  from your health care provider about eating or drinking restrictions. ?Resume your normal diet as instructed by your health care provider. Avoid heavy or fried foods that are hard to digest. ?Activity ?Rest as told by your health care provider. ?Avoid sitting for a long time without moving. Get up to take short walks every 1-2 hours. This is important to improve blood flow and breathing. Ask for help if you feel weak or unsteady. ?Return to your normal activities as told by your health care provider. Ask your health care provider what activities are safe for you. ?Managing cramping and bloating ? ?Try walking around when you have cramps or feel bloated. ?Apply heat to your abdomen as told by your health care provider. Use the heat source that your health care provider recommends, such as a moist heat pack or a heating pad. ?Place a towel between your skin and the heat source. ?Leave the heat on for 20-30 minutes. ?Remove the heat if your skin turns bright red. This is especially important if you are unable to feel pain, heat, or cold. You may have a greater risk of getting burned. ?General instructions ?If you were given a sedative during the procedure, it can affect you for several hours. Do not drive or operate machinery  until your health care provider says that it is safe. ?For the first 24 hours after the procedure: ?Do not sign important documents. ?Do not drink alcohol. ?Do your regular daily activities at a slower pace than normal. ?Eat soft foods that are easy to digest. ?Take over-the-counter and prescription medicines only as told by your health care provider. ?Keep all follow-up visits as told by your health care provider. This is important. ?Contact a health care provider if: ?You have blood in your stool 2-3 days after the procedure. ?Get help right away if you have: ?More than a small spotting of blood in your stool. ?Large blood clots in your stool. ?Swelling of your abdomen. ?Nausea or vomiting. ?A  fever. ?Increasing pain in your abdomen that is not relieved with medicine. ?Summary ?After the procedure, it is common to have a small amount of blood in your stool. You may also have mild cramping and bloating of your abdomen. ?If you were given a sedative during the procedure, it can affect you for several hours. Do not drive or operate machinery until your health care provider says that it is safe. ?Get help right away if you have a lot of blood in your stool, nausea or vomiting, a fever, or increased pain in your abdomen. ?This information is not intended to replace advice given to you by your health care provider. Make sure you discuss any questions you have with your health care provider. ?Document Revised: 10/08/2019 Document Reviewed: 06/28/2019 ?Elsevier Patient Education ? [place]. ?Monitored Anesthesia Care, Care After ?This sheet gives you information about how to care for yourself after your procedure. Your health care provider may also give you more specific instructions. If you have problems or questions, contact your health care provider. ?What can I expect after the procedure? ?After the procedure, it is common to have: ?Tiredness. ?Forgetfulness about what happened after the procedure. ?Impaired judgment for important decisions. ?Nausea or vomiting. ?Some difficulty with balance. ?Follow these instructions at home: ?For the time period you were told by your health care provider: ?  ?Rest as needed. ?Do not participate in activities where you could fall or become injured. ?Do not drive or use machinery. ?Do not drink alcohol. ?Do not take sleeping pills or medicines that cause drowsiness. ?Do not make important decisions or sign legal documents. ?Do not take care of children on your own. ?Eating and drinking ?Follow the diet that is recommended by your health care provider. ?Drink enough fluid to keep your urine pale yellow. ?If you vomit: ?Drink water, juice, or soup when you can drink  without vomiting. ?Make sure you have little or no nausea before eating solid foods. ?General instructions ?Have a responsible adult stay with you for the time you are told. It is important to have someone help care for you until you are awake and alert. ?Take over-the-counter and prescription medicines only as told by your health care provider. ?If you have sleep apnea, surgery and certain medicines can increase your risk for breathing problems. Follow instructions from your health care provider about wearing your sleep device: ?Anytime you are sleeping, including during daytime naps. ?While taking prescription pain medicines, sleeping medicines, or medicines that make you drowsy. ?Avoid smoking. ?Keep all follow-up visits as told by your health care provider. This is important. ?Contact a health care provider if: ?You keep feeling nauseous or you keep vomiting. ?You feel light-headed. ?You are still sleepy or having trouble with balance after 24 hours. ?You develop a  rash. ?You have a fever. ?You have redness or swelling around the IV site. ?Get help right away if: ?You have trouble breathing. ?You have new-onset confusion at home. ?Summary ?For several hours after your procedure, you may feel tired. You may also be forgetful and have poor judgment. ?Have a responsible adult stay with you for the time you are told. It is important to have someone help care for you until you are awake and alert. ?Rest as told. Do not drive or operate machinery. Do not drink alcohol or take sleeping pills. ?Get help right away if you have trouble breathing, or if you suddenly become confused. ?This information is not intended to replace advice given to you by your health care provider. Make sure you discuss any questions you have with your health care provider. ?Document Revised: 08/17/2020 Document Reviewed: 11/04/2019 ?Elsevier Patient Education ? [place]. ? ?

## 2022-02-19 ENCOUNTER — Encounter (HOSPITAL_COMMUNITY): Payer: Self-pay

## 2022-02-19 ENCOUNTER — Encounter (HOSPITAL_COMMUNITY)
Admission: RE | Admit: 2022-02-19 | Discharge: 2022-02-19 | Disposition: A | Discharge status: Home / Self Care | Payer: BC Managed Care – PPO | Source: Ambulatory Visit | Attending: Internal Medicine | Admitting: Internal Medicine

## 2022-02-19 ENCOUNTER — Other Ambulatory Visit: Payer: Self-pay

## 2022-02-20 ENCOUNTER — Other Ambulatory Visit (HOSPITAL_COMMUNITY)
Admission: RE | Admit: 2022-02-20 | Discharge: 2022-02-20 | Disposition: A | Discharge status: Home / Self Care | Payer: BC Managed Care – PPO | Source: Ambulatory Visit | Attending: Internal Medicine | Admitting: Internal Medicine

## 2022-02-20 DIAGNOSIS — K648 Other hemorrhoids: Secondary | ICD-10-CM | POA: Diagnosis not present

## 2022-02-20 DIAGNOSIS — Z9221 Personal history of antineoplastic chemotherapy: Secondary | ICD-10-CM | POA: Diagnosis not present

## 2022-02-20 DIAGNOSIS — Z1211 Encounter for screening for malignant neoplasm of colon: Secondary | ICD-10-CM | POA: Insufficient documentation

## 2022-02-20 DIAGNOSIS — Z85118 Personal history of other malignant neoplasm of bronchus and lung: Secondary | ICD-10-CM | POA: Diagnosis not present

## 2022-02-20 DIAGNOSIS — Z902 Acquired absence of lung [part of]: Secondary | ICD-10-CM | POA: Diagnosis not present

## 2022-02-20 DIAGNOSIS — R519 Headache, unspecified: Secondary | ICD-10-CM | POA: Diagnosis not present

## 2022-02-20 LAB — PREGNANCY, URINE: Preg Test, Ur: NEGATIVE

## 2022-02-22 ENCOUNTER — Encounter (HOSPITAL_COMMUNITY)
Admission: RE | Disposition: A | Discharge status: Home / Self Care | Payer: Self-pay | Source: Home / Self Care | Attending: Internal Medicine

## 2022-02-22 ENCOUNTER — Ambulatory Visit (HOSPITAL_COMMUNITY): Payer: BC Managed Care – PPO | Admitting: Certified Registered Nurse Anesthetist

## 2022-02-22 ENCOUNTER — Ambulatory Visit (HOSPITAL_COMMUNITY)
Admission: RE | Admit: 2022-02-22 | Discharge: 2022-02-22 | Disposition: A | Discharge status: Home / Self Care | Payer: BC Managed Care – PPO | Attending: Internal Medicine | Admitting: Internal Medicine

## 2022-02-22 ENCOUNTER — Encounter (HOSPITAL_COMMUNITY): Payer: Self-pay

## 2022-02-22 DIAGNOSIS — Z85118 Personal history of other malignant neoplasm of bronchus and lung: Secondary | ICD-10-CM | POA: Diagnosis not present

## 2022-02-22 DIAGNOSIS — R519 Headache, unspecified: Secondary | ICD-10-CM | POA: Diagnosis not present

## 2022-02-22 DIAGNOSIS — Z902 Acquired absence of lung [part of]: Secondary | ICD-10-CM | POA: Diagnosis not present

## 2022-02-22 DIAGNOSIS — Z9221 Personal history of antineoplastic chemotherapy: Secondary | ICD-10-CM | POA: Insufficient documentation

## 2022-02-22 DIAGNOSIS — Z1211 Encounter for screening for malignant neoplasm of colon: Secondary | ICD-10-CM | POA: Diagnosis not present

## 2022-02-22 DIAGNOSIS — K648 Other hemorrhoids: Secondary | ICD-10-CM | POA: Insufficient documentation

## 2022-02-22 SURGERY — COLONOSCOPY WITH PROPOFOL
Anesthesia: General

## 2022-02-22 MED ORDER — LIDOCAINE HCL (CARDIAC) PF 100 MG/5ML IV SOSY
PREFILLED_SYRINGE | INTRAVENOUS | Status: DC | PRN
  Administered 2022-02-22: 50 mg via INTRAVENOUS

## 2022-02-22 MED ORDER — LACTATED RINGERS IV SOLN
INTRAVENOUS | Status: DC
  Administered 2022-02-22: 1000 mL via INTRAVENOUS

## 2022-02-22 MED ORDER — PROPOFOL 10 MG/ML IV BOLUS
INTRAVENOUS | Status: DC | PRN
  Administered 2022-02-22: 60 mg via INTRAVENOUS
  Administered 2022-02-22: 140 mg via INTRAVENOUS
  Administered 2022-02-22: 20 mg via INTRAVENOUS

## 2022-02-22 NOTE — H&P (Signed)
Primary Care Physician:  Manon Hilding, MD ?Primary Gastroenterologist:  Dr. Abbey Chatters ? ?Pre-Procedure History & Physical: ?HPI:  Kathleen Hughes is a 46 y.o. female is here for first ever colonoscopy for colon cancer screening purposes.  Patient denies any family history of colorectal cancer.  No melena or hematochezia.  No abdominal pain or unintentional weight loss.  No change in bowel habits.  Overall feels well from a GI standpoint. ? ?Past Medical History:  ?Diagnosis Date  ? Atypical mole 01/19/2014  ? mild right breast  ? Basal cell carcinoma 01/17/2009  ? nose bowens  tx-[place]ara  ? Basal cell carcinoma of skin 03/13/2021  ? nod-left breast ([facility])  ? BCC (basal cell carcinoma of skin) 07/08/2013  ? chest tx-cx3 12fu  ? Breast mass, right 08/24/2013  ? 1 at 12-1 o'clock and 1 at 3 o'clock will get mammogram and [place] at breast center  ? Contraceptive management 02/15/2015  ? Lung cancer ([place]) 04/2021  ? Stage 3  ? Migraines   ? PONV (postoperative nausea and vomiting)   ? Recurrent cold sores   ? UTI (lower urinary tract infection) 02/15/2015  ? ? ?Past Surgical History:  ?Procedure Laterality Date  ? BREAST BIOPSY Right   ? CESAREAN SECTION    ? INTERCOSTAL NERVE BLOCK Right 04/18/2021  ? Procedure: INTERCOSTAL NERVE BLOCK;  Surgeon: Melrose Nakayama, MD;  Location: [facility];  Service: Thoracic;  Laterality: Right;  ? NODE DISSECTION Right 04/18/2021  ? Procedure: NODE DISSECTION;  Surgeon: Melrose Nakayama, MD;  Location: [facility];  Service: Thoracic;  Laterality: Right;  ? ? ?Prior to Admission medications   ?Medication Sig Start Date End Date Taking? Authorizing Provider  ?DENTA 5000 PLUS 1.1 % CREA dental cream Place 1 application onto teeth at bedtime. 01/11/22  Yes [provider]  ?hydrOXYzine (ATARAX/VISTARIL) 25 MG tablet Take 25 mg by mouth at bedtime. 10/15/21  Yes [provider]  ?LORazepam (ATIVAN) 0.5 MG tablet Take 0.5 mg by mouth at bedtime as needed for sleep. 01/31/22  Yes  [provider]  ?norethindrone ([name]) 0.35 MG tablet Take 1 tablet (0.35 mg total) by mouth daily. 11/07/21  Yes Derrek Monaco A, NP  ?NURTEC 75 MG TBDP Take 1 tablet by mouth daily as needed (migraines). 05/02/21  Yes [provider]  ?topiramate (TOPAMAX) 25 MG tablet Take 25 mg by mouth daily. 07/21/20  Yes [provider]  ? ? ?Allergies as of 01/23/2022 - Review Complete 01/02/2022  ?Allergen Reaction Noted  ? Codone [hydrocodone] Nausea Only 04/26/2021  ? ? ?History reviewed. No pertinent family history. ? ?Social History  ? ?Socioeconomic History  ? Marital status: Married  ?  Spouse name: Not on file  ? Number of children: Not on file  ? Years of education: Not on file  ? Highest education level: Not on file  ?Occupational History  ? Not on file  ?Tobacco Use  ? Smoking status: Never  ? Smokeless tobacco: Never  ?Vaping Use  ? Vaping Use: Never used  ?Substance and Sexual Activity  ? Alcohol use: No  ? Drug use: No  ? Sexual activity: Yes  ?  Birth control/protection: Pill  ?Other Topics Concern  ? Not on file  ?Social History Narrative  ? Not on file  ? ?Social Determinants of Health  ? ?Financial Resource Strain: Medium Risk  ? Difficulty of Paying Living Expenses: Somewhat hard  ?Food Insecurity: No Food Insecurity  ? Worried About Running  Out of Food in the Last Year: Never true  ? Ran Out of Food in the Last Year: Never true  ?Transportation Needs: No Transportation Needs  ? Lack of Transportation (Medical): No  ? Lack of Transportation (Non-Medical): No  ?Physical Activity: Insufficiently Active  ? Days of Exercise per Week: 5 days  ? Minutes of Exercise per Session: 20 min  ?Stress: No Stress Concern Present  ? Feeling of Stress : Only a little  ?Social Connections: Moderately Integrated  ? Frequency of Communication with Friends and Family: More than three times a week  ? Frequency of Social Gatherings with Friends and Family: Once a week  ? Attends Religious Services:  More than 4 times per year  ? Active Member of Clubs or Organizations: No  ? Attends Archivist Meetings: Patient refused  ? Marital Status: Married  ?Intimate Partner Violence: Not At Risk  ? Fear of Current or Ex-Partner: No  ? Emotionally Abused: No  ? Physically Abused: No  ? Sexually Abused: No  ? ? ?Review of Systems: ?See HPI, otherwise negative ROS ? ?Physical Exam: ?Vital signs in last 24 hours: ?Temp:  [98.4 ?F (36.9 ?C)] 98.4 ?F (36.9 ?C) (03/10 4562) ?Pulse Rate:  [67] 67 (03/10 0816) ?Resp:  [18] 18 (03/10 0816) ?BP: (121)/(71) 121/71 (03/10 0816) ?SpO2:  [100 %] 100 % (03/10 0816) ?Weight:  [57.6 kg] 57.6 kg (03/10 0816) ?  ?General:   Alert,  Well-developed, well-nourished, pleasant and cooperative in NAD ?Head:  Normocephalic and atraumatic. ?Eyes:  Sclera clear, no icterus.   Conjunctiva pink. ?Ears:  Normal auditory acuity. ?Nose:  No deformity, discharge,  or lesions. ?Mouth:  No deformity or lesions, dentition normal. ?Neck:  Supple; no masses or thyromegaly. ?Lungs:  Clear throughout to auscultation.   No wheezes, crackles, or rhonchi. No acute distress. ?Heart:  Regular rate and rhythm; no murmurs, clicks, rubs,  or gallops. ?Abdomen:  Soft, nontender and nondistended. No masses, hepatosplenomegaly or hernias noted. Normal bowel sounds, without guarding, and without rebound.   ?Msk:  Symmetrical without gross deformities. Normal posture. ?Extremities:  Without clubbing or edema. ?Neurologic:  Alert and  oriented x4;  grossly normal neurologically. ?Skin:  Intact without significant lesions or rashes. ?Cervical Nodes:  No significant cervical adenopathy. ?Psych:  Alert and cooperative. Normal mood and affect. ? ?Impression/Plan: ?Kathleen Hughes is here for a colonoscopy to be performed for colon cancer screening purposes. ? ?The risks of the procedure including infection, bleed, or perforation as well as benefits, limitations, alternatives and imponderables have been reviewed with  the patient. Questions have been answered. All parties agreeable. ? ? ?

## 2022-02-22 NOTE — Op Note (Signed)
[facility] ?Patient Name: Kathleen Hughes ?Procedure Date: 02/22/2022 8:55 AM ?MRN: [id number] ?Date of Birth: 1976-03-23 ?Attending MD: Elon Alas. Abbey Chatters , DO ?CSN: [id number] ?Age: 46 ?Admit Type: Outpatient ?Procedure:                Colonoscopy ?Indications:              Screening for colorectal malignant neoplasm ?Providers:                Elon Alas. Abbey Chatters, DO, Noyack Page, Raphael Gibney,  ?                          Technician ?Referring MD:              ?Medicines:                See the Anesthesia note for documentation of the  ?                          administered medications ?Complications:            No immediate complications. ?Estimated Blood Loss:     Estimated blood loss: none. ?Procedure:                Pre-Anesthesia Assessment: ?                          - The anesthesia plan was to use monitored  ?                          anesthesia care (MAC). ?                          After obtaining informed consent, the colonoscope  ?                          was passed under direct vision. Throughout the  ?                          procedure, the patient's blood pressure, pulse, and  ?                          oxygen saturations were monitored continuously. The  ?                          PCF-HQ190L ([id number]) scope was introduced through  ?                          the anus and advanced to the the cecum, identified  ?                          by appendiceal orifice and ileocecal valve. The  ?                          colonoscopy was performed without difficulty. The  ?                          patient tolerated the procedure well. The quality  ?  of the bowel preparation was evaluated using the  ?                          BBPS [facility] Bowel Preparation Scale) with scores  ?                          of: Right Colon = 3, Transverse Colon = 3 and Left  ?                          Colon = 3 (entire mucosa seen well with no residual  ?                          staining, small  fragments of stool or opaque  ?                          liquid). The total BBPS score equals 9. ?Scope In: 9:03:47 AM ?Scope Out: 9:12:50 AM ?Scope Withdrawal Time: 0 hours 6 minutes 19 seconds  ?Total Procedure Duration: 0 hours 9 minutes 3 seconds  ?Findings: ?     The perianal and digital rectal examinations were normal. ?     Non-bleeding internal hemorrhoids were found during endoscopy. ?     The exam was otherwise without abnormality. ?Impression:               - Non-bleeding internal hemorrhoids. ?                          - The examination was otherwise normal. ?                          - No specimens collected. ?Moderate Sedation: ?     Per Anesthesia Care ?Recommendation:           - Patient has a contact number available for  ?                          emergencies. The signs and symptoms of potential  ?                          delayed complications were discussed with the  ?                          patient. Return to normal activities tomorrow.  ?                          Written discharge instructions were provided to the  ?                          patient. ?                          - Resume previous diet. ?                          - Continue present medications. ?                          -  Repeat colonoscopy in 10 years for screening  ?                          purposes. ?                          - Return to GI clinic PRN. ?Procedure Code(s):        --- Professional --- ?                          J5009, Colorectal cancer screening; colonoscopy on  ?                          individual not meeting criteria for high risk ?Diagnosis Code(s):        --- Professional --- ?                          Z12.11, Encounter for screening for malignant  ?                          neoplasm of colon ?                          K64.8, Other hemorrhoids ?CPT copyright 2019 American Medical Association. All rights reserved. ?The codes documented in this report are preliminary and upon coder review may  ?be revised  to meet current compliance requirements. ?Elon Alas. Abbey Chatters, DO ?Elon Alas. Abbey Chatters, DO ?02/22/2022 9:14:42 AM ?This report has been signed electronically. ?Number of Addenda: 0 ?

## 2022-02-22 NOTE — Anesthesia Preprocedure Evaluation (Signed)
Anesthesia Evaluation  ?Patient identified by MRN, date of birth, ID band ?Patient awake ? ? ? ?Reviewed: ?Allergy & Precautions, H&P , NPO status , Patient's Chart, lab work & pertinent test results, reviewed documented beta blocker date and time  ? ?History of Anesthesia Complications ?(+) PONV and history of anesthetic complications ? ?Airway ?Mallampati: II ? ?TM Distance: >3 FB ?Neck ROM: full ? ? ? Dental ?no notable dental hx. ? ?  ?Pulmonary ?neg pulmonary ROS,  ?  ?Pulmonary exam normal ?breath sounds clear to auscultation ? ? ? ? ? ? Cardiovascular ?Exercise Tolerance: Good ?negative cardio ROS ? ? ?Rhythm:regular Rate:Normal ? ? ?  ?Neuro/Psych ? Headaches, negative psych ROS  ? GI/Hepatic ?negative GI ROS, Neg liver ROS,   ?Endo/Other  ?negative endocrine ROS ? Renal/GU ?negative Renal ROS  ?negative genitourinary ?  ?Musculoskeletal ? ? Abdominal ?  ?Peds ? Hematology ?negative hematology ROS ?(+)   ?Anesthesia Other Findings ?Lung CA, s/p segmental resection and chemo ? Reproductive/Obstetrics ?negative OB ROS ? ?  ? ? ? ? ? ? ? ? ? ? ? ? ? ?  ?  ? ? ? ? ? ? ? ? ?Anesthesia Physical ?Anesthesia Plan ? ?ASA: 3 ? ?Anesthesia Plan: General  ? ?Post-op Pain Management:   ? ?Induction:  ? ?PONV Risk Score and Plan: Propofol infusion ? ?Airway Management Planned:  ? ?Additional Equipment:  ? ?Intra-op Plan:  ? ?Post-operative Plan:  ? ?Informed Consent: I have reviewed the patients History and Physical, chart, labs and discussed the procedure including the risks, benefits and alternatives for the proposed anesthesia with the patient or authorized representative who has indicated his/her understanding and acceptance.  ? ? ? ?Dental Advisory Given ? ?Plan Discussed with: CRNA ? ?Anesthesia Plan Comments:   ? ? ? ? ? ? ?Anesthesia Quick Evaluation ? ?

## 2022-02-22 NOTE — Discharge Instructions (Addendum)
?  Colonoscopy ?Discharge Instructions ? ?Read the instructions outlined below and refer to this sheet in the next few weeks. These discharge instructions provide you with general information on caring for yourself after you leave the hospital. Your doctor may also give you specific instructions. While your treatment has been planned according to the most current medical practices available, unavoidable complications occasionally occur.  ? ?ACTIVITY ?You may resume your regular activity, but move at a slower pace for the next 24 hours.  ?Take frequent rest periods for the next 24 hours.  ?Walking will help get rid of the air and reduce the bloated feeling in your belly (abdomen).  ?No driving for 24 hours (because of the medicine (anesthesia) used during the test).   ?Do not sign any important legal documents or operate any machinery for 24 hours (because of the anesthesia used during the test).  ?NUTRITION ?Drink plenty of fluids.  ?You may resume your normal diet as instructed by your doctor.  ?Begin with a light meal and progress to your normal diet. Heavy or fried foods are harder to digest and may make you feel sick to your stomach (nauseated).  ?Avoid alcoholic beverages for 24 hours or as instructed.  ?MEDICATIONS ?You may resume your normal medications unless your doctor tells you otherwise.  ?WHAT YOU CAN EXPECT TODAY ?Some feelings of bloating in the abdomen.  ?Passage of more gas than usual.  ?Spotting of blood in your stool or on the toilet paper.  ?IF YOU HAD POLYPS REMOVED DURING THE COLONOSCOPY: ?No aspirin products for 7 days or as instructed.  ?No alcohol for 7 days or as instructed.  ?Eat a soft diet for the next 24 hours.  ?FINDING OUT THE RESULTS OF YOUR TEST ?Not all test results are available during your visit. If your test results are not back during the visit, make an appointment with your caregiver to find out the results. Do not assume everything is normal if you have not heard from your  caregiver or the medical facility. It is important for you to follow up on all of your test results.  ?SEEK IMMEDIATE MEDICAL ATTENTION IF: ?You have more than a spotting of blood in your stool.  ?Your belly is swollen (abdominal distention).  ?You are nauseated or vomiting.  ?You have a temperature over 101.  ?You have abdominal pain or discomfort that is severe or gets worse throughout the day.  ? ?Your colonoscopy was relatively unremarkable.  I did not find any polyps or evidence of colon cancer.  I recommend repeating colonoscopy in 10 years for colon cancer screening purposes. Otherwise Follow-up with GI as needed. ? ? ?It was very nice meeting you today. I hope you have a great weekend ? ?Elon Alas. Abbey Chatters, D.O. ?Gastroenterology and Hepatology ?[facility] ? ?

## 2022-02-22 NOTE — Transfer of Care (Signed)
Immediate Anesthesia Transfer of Care Note ? ?Patient: Kathleen Hughes ? ?Procedure(s) Performed: COLONOSCOPY WITH PROPOFOL ? ?Patient Location: Short Stay ? ?Anesthesia Type:General ? ?Level of Consciousness: drowsy ? ?Airway & Oxygen Therapy: Patient Spontanous Breathing ? ?Post-op Assessment: Report given to RN and Post -op Vital signs reviewed and stable ? ?Post vital signs: Reviewed and stable ? ?Last Vitals:  ?Vitals Value Taken Time  ?BP    ?Temp    ?Pulse    ?Resp    ?SpO2    ? ? ?Last Pain:  ?Vitals:  ? 02/22/22 0900  ?TempSrc:   ?PainSc: 0-No pain  ?   ? ?Patients Stated Pain Goal: 8 (02/22/22 0816) ? ?Complications: No notable events documented. ?

## 2022-02-22 NOTE — Anesthesia Procedure Notes (Signed)
Date/Time: 02/22/2022 9:06 AM ?Performed by: Karna Dupes, CRNA ?Pre-anesthesia Checklist: Patient identified, Emergency Drugs available, Suction available and Patient being monitored ?Patient Re-evaluated:Patient Re-evaluated prior to induction ?Oxygen Delivery Method: Nasal cannula ?Induction Type: IV induction ?Placement Confirmation: positive ETCO2 ? ? ? ? ?

## 2022-02-25 NOTE — Anesthesia Postprocedure Evaluation (Signed)
Anesthesia Post Note ? ?Patient: Kathleen Hughes ? ?Procedure(s) Performed: COLONOSCOPY WITH PROPOFOL ? ?Patient location during evaluation: Phase II ?Anesthesia Type: General ?Level of consciousness: awake ?Pain management: pain level controlled ?Vital Signs Assessment: post-procedure vital signs reviewed and stable ?Respiratory status: spontaneous breathing and respiratory function stable ?Cardiovascular status: blood pressure returned to baseline and stable ?Postop Assessment: no headache and no apparent nausea or vomiting ?Anesthetic complications: no ?Comments: Late entry ? ? ?No notable events documented. ? ? ?Last Vitals:  ?Vitals:  ? 02/22/22 0816 02/22/22 0916  ?BP: 121/71 (!) 113/57  ?Pulse: 67 79  ?Resp: 18 15  ?Temp: 36.9 ?C 36.6 ?C  ?SpO2: 100% 100%  ?  ?Last Pain:  ?Vitals:  ? 02/22/22 0916  ?TempSrc: Oral  ?PainSc: 0-No pain  ? ? ?  ?  ?  ?  ?  ?  ? ?Louann Sjogren ? ? ? ? ?

## 2022-02-27 ENCOUNTER — Encounter (HOSPITAL_COMMUNITY): Payer: Self-pay | Admitting: Internal Medicine

## 2022-03-07 DIAGNOSIS — B001 Herpesviral vesicular dermatitis: Secondary | ICD-10-CM | POA: Diagnosis not present

## 2022-03-07 DIAGNOSIS — Z6822 Body mass index (BMI) 22.0-22.9, adult: Secondary | ICD-10-CM | POA: Diagnosis not present

## 2022-03-07 DIAGNOSIS — H9201 Otalgia, right ear: Secondary | ICD-10-CM | POA: Diagnosis not present

## 2022-03-07 DIAGNOSIS — J0101 Acute recurrent maxillary sinusitis: Secondary | ICD-10-CM | POA: Diagnosis not present

## 2022-03-13 ENCOUNTER — Ambulatory Visit: Payer: BC Managed Care – PPO | Admitting: Physician Assistant

## 2022-03-13 ENCOUNTER — Other Ambulatory Visit: Payer: Self-pay

## 2022-03-13 DIAGNOSIS — C4492 Squamous cell carcinoma of skin, unspecified: Secondary | ICD-10-CM

## 2022-03-13 DIAGNOSIS — D485 Neoplasm of uncertain behavior of skin: Secondary | ICD-10-CM

## 2022-03-13 DIAGNOSIS — Z1283 Encounter for screening for malignant neoplasm of skin: Secondary | ICD-10-CM | POA: Diagnosis not present

## 2022-03-13 DIAGNOSIS — D2239 Melanocytic nevi of other parts of face: Secondary | ICD-10-CM

## 2022-03-13 DIAGNOSIS — Z85828 Personal history of other malignant neoplasm of skin: Secondary | ICD-10-CM | POA: Diagnosis not present

## 2022-03-13 DIAGNOSIS — D0439 Carcinoma in situ of skin of other parts of face: Secondary | ICD-10-CM

## 2022-03-13 HISTORY — DX: Squamous cell carcinoma of skin, unspecified: C44.92

## 2022-03-13 NOTE — Patient Instructions (Addendum)
?  Genetic testing- Roma Kayser at [facility]  ? ?Biopsy, Surgery (Curettage) & Surgery (Excision) Aftercare Instructions ? ?1. Okay to remove bandage in 24 hours ? ?2. Wash area with soap and water ? ?3. Apply Vaseline to area twice daily until healed (Not Neosporin) ? ?4. Okay to cover with a Band-Aid to decrease the chance of infection or prevent irritation from clothing; also it's okay to uncover lesion at home. ? ?5. Suture instructions: return to our office in 7-10 or 10-14 days for a nurse visit for suture removal. Variable healing with sutures, if pain or itching occurs call our office. It's okay to shower or bathe 24 hours after sutures are given. ? ?6. The following risks may occur after a biopsy, curettage or excision: bleeding, scarring, discoloration, recurrence, infection (redness, yellow drainage, pain or swelling). ? ?7. For questions, concerns and results call our office at [facility] before 4pm & Friday before 3pm. Biopsy results will be available in 1 week. ? ?

## 2022-04-01 ENCOUNTER — Telehealth: Payer: Self-pay

## 2022-04-01 ENCOUNTER — Encounter: Payer: Self-pay | Admitting: Physician Assistant

## 2022-04-01 NOTE — Telephone Encounter (Signed)
Phone call to patient with her pathology results. Patient aware of results.  

## 2022-04-01 NOTE — Telephone Encounter (Signed)
-----   Message from Warren Danes, [place] sent at 04/01/2022  1:26 PM EDT ----- ?30 for chin. Apologize for late results. Computer error.  ?

## 2022-04-01 NOTE — Progress Notes (Signed)
? ?  Follow-Up Visit ?  ?Subjective  ?Kathleen Hughes is a 46 y.o. female who presents for the following: Annual Exam (Here for full body skin exam. Concerns are nose and face. History of BCC on nose. ). ? ? ?The following portions of the chart were reviewed this encounter and updated as appropriate:  Tobacco  Allergies  Meds  Problems  Med Hx  Surg Hx  Fam Hx   ?  ? ?Objective  ?Well appearing patient in no apparent distress; mood and affect are within normal limits. ? ?A full examination was performed including scalp, head, eyes, ears, nose, lips, neck, chest, axillae, abdomen, back, buttocks, bilateral upper extremities, bilateral lower extremities, hands, feet, fingers, toes, fingernails, and toenails. All findings within normal limits unless otherwise noted below. ? ?Right Chin ?Hyperkeratotic scale with pink base  ? ? ? ? ? ? ?Mid Tip of Nose ?Hyperkeratotic scale with pink base  ? ? ? ? ? ? ? ?Assessment & Plan  ?Neoplasm of uncertain behavior of skin (2) ?Right Chin ? ?Skin / nail biopsy ?Type of biopsy: tangential   ?Informed consent: discussed and consent obtained   ?Timeout: patient name, date of birth, surgical site, and procedure verified   ?Anesthesia: the lesion was anesthetized in a standard fashion   ?Anesthetic:  1% lidocaine w/ epinephrine 1-100,000 local infiltration ?Instrument used: flexible razor blade   ?Hemostasis achieved with: aluminum chloride and electrodesiccation   ?Outcome: patient tolerated procedure well   ?Post-procedure details: wound care instructions given   ? ?Specimen 1 - Surgical pathology ?Differential Diagnosis: bcc vs scc ? ?Check Margins: No ? ?Mid Tip of Nose ? ?Skin / nail biopsy ?Type of biopsy: tangential   ?Informed consent: discussed and consent obtained   ?Timeout: patient name, date of birth, surgical site, and procedure verified   ?Anesthesia: the lesion was anesthetized in a standard fashion   ?Anesthetic:  1% lidocaine w/ epinephrine 1-100,000 local  infiltration ?Instrument used: flexible razor blade   ?Hemostasis achieved with: aluminum chloride and electrodesiccation   ?Outcome: patient tolerated procedure well   ?Post-procedure details: wound care instructions given   ? ?Specimen 2 - Surgical pathology ?Differential Diagnosis: bcc vs scc ? ?Check Margins: No ? ? ? ?I, Mylan Schwarz, PA-C, have reviewed all documentation's for this visit.  The documentation on 04/01/22 for the exam, diagnosis, procedures and orders are all accurate and complete. ?

## 2022-04-08 DIAGNOSIS — J90 Pleural effusion, not elsewhere classified: Secondary | ICD-10-CM | POA: Diagnosis not present

## 2022-04-08 DIAGNOSIS — C3491 Malignant neoplasm of unspecified part of right bronchus or lung: Secondary | ICD-10-CM | POA: Diagnosis not present

## 2022-06-12 IMAGING — DX DG CHEST 1V PORT
1 series · 1 of 1 positions shown · non-contrast
Comparison: April 16, 2021

CLINICAL DATA: Postoperative evaluation

EXAM:
PORTABLE CHEST 1 VIEW

[chest]
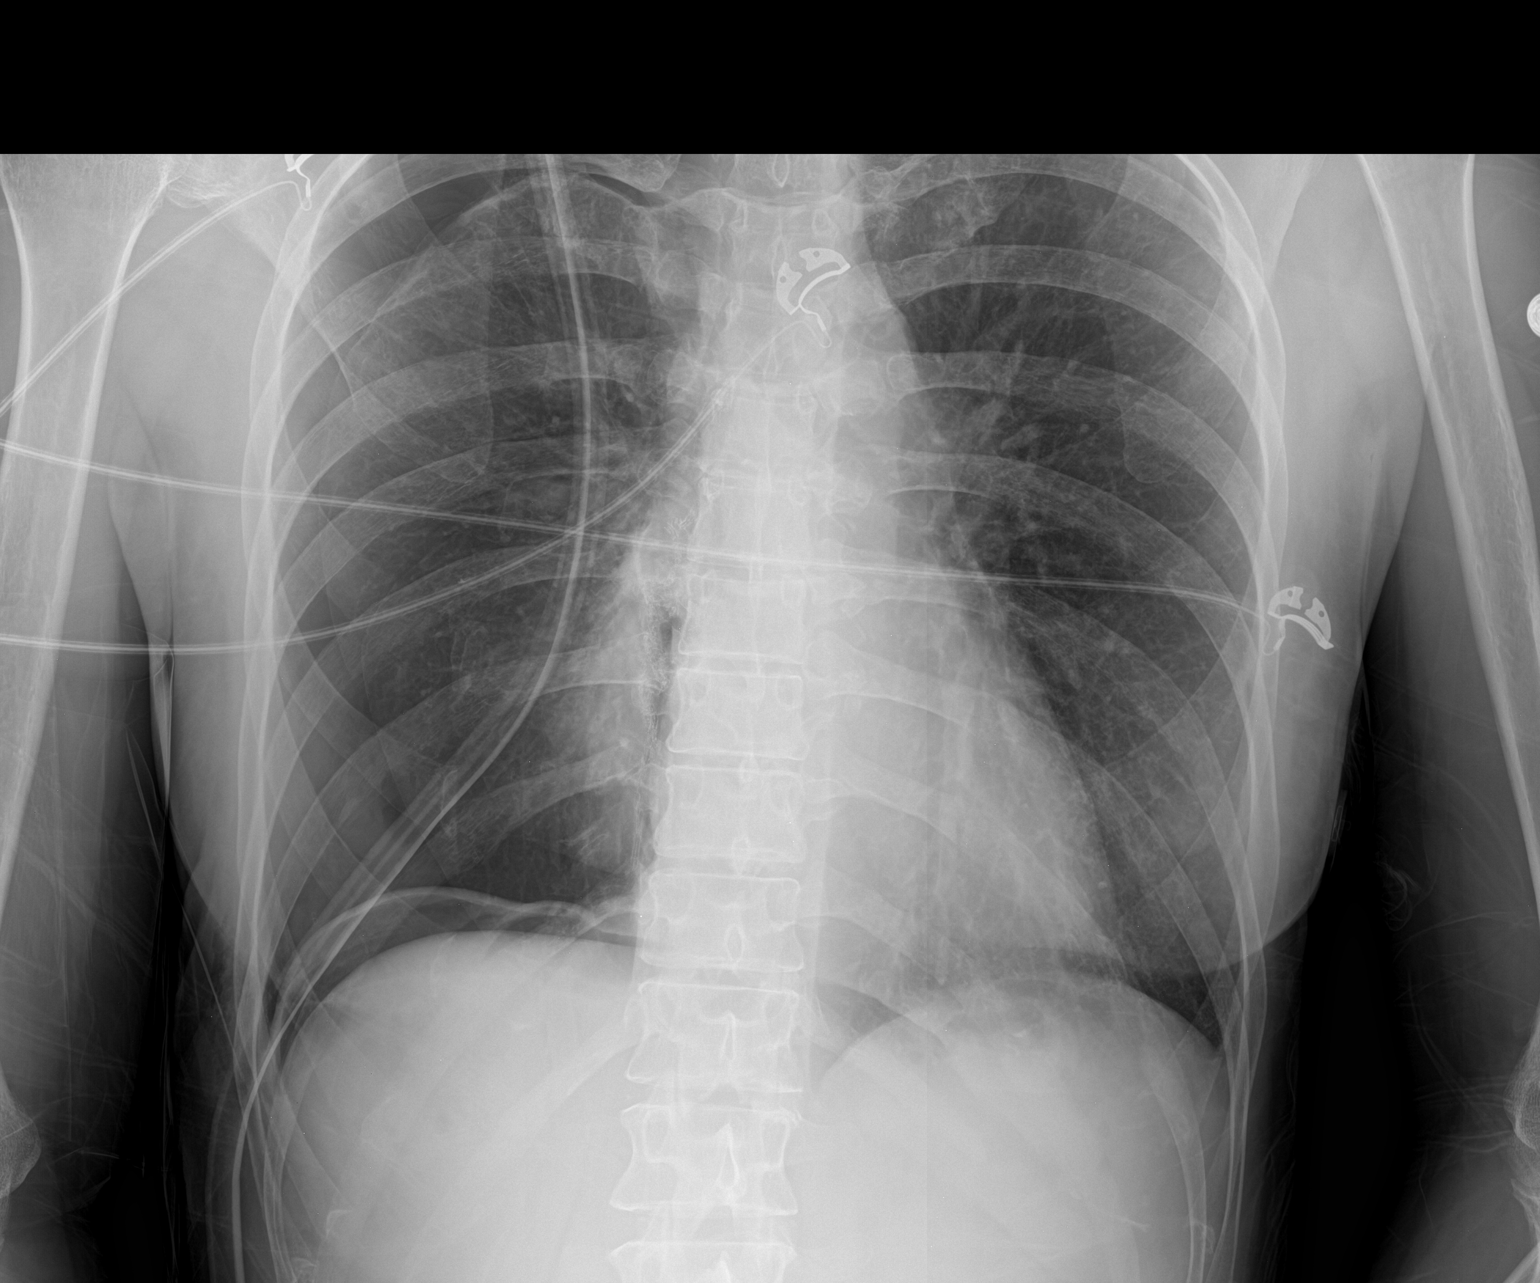

[1 of 1 positions shown; findings below may reference images not displayed]

FINDINGS: Chest tube present with tip in the right apex region. There is a
sizable pneumothorax on the right without tension component. There
is postoperative change medially on the right.

No edema or airspace opacity.  Heart size normal.

There is apparent pneumoperitoneum. No bone lesions. Status post
right mastectomy.
IMPRESSION: 1. Chest tube in place with large pneumothorax. No tension
component.

2.  Appearance concerning for a degree of pneumoperitoneum.

3. No edema or airspace opacity. Postoperative change medial right
lung.

4.  Status post right mastectomy.

Critical Value/emergent results were called by telephone at the time
of interpretation on 04/18/2021 at [DATE] to provider DADILSON LOMBA ,
who verbally acknowledged these results.

## 2022-06-18 IMAGING — CR DG CHEST 2V
2 series · 2 of 2 positions shown · non-contrast
Comparison: 04/22/2021

CLINICAL DATA: Status post right lower lobe wedge resection.

EXAM:
CHEST - 2 VIEW

[w chest pa]
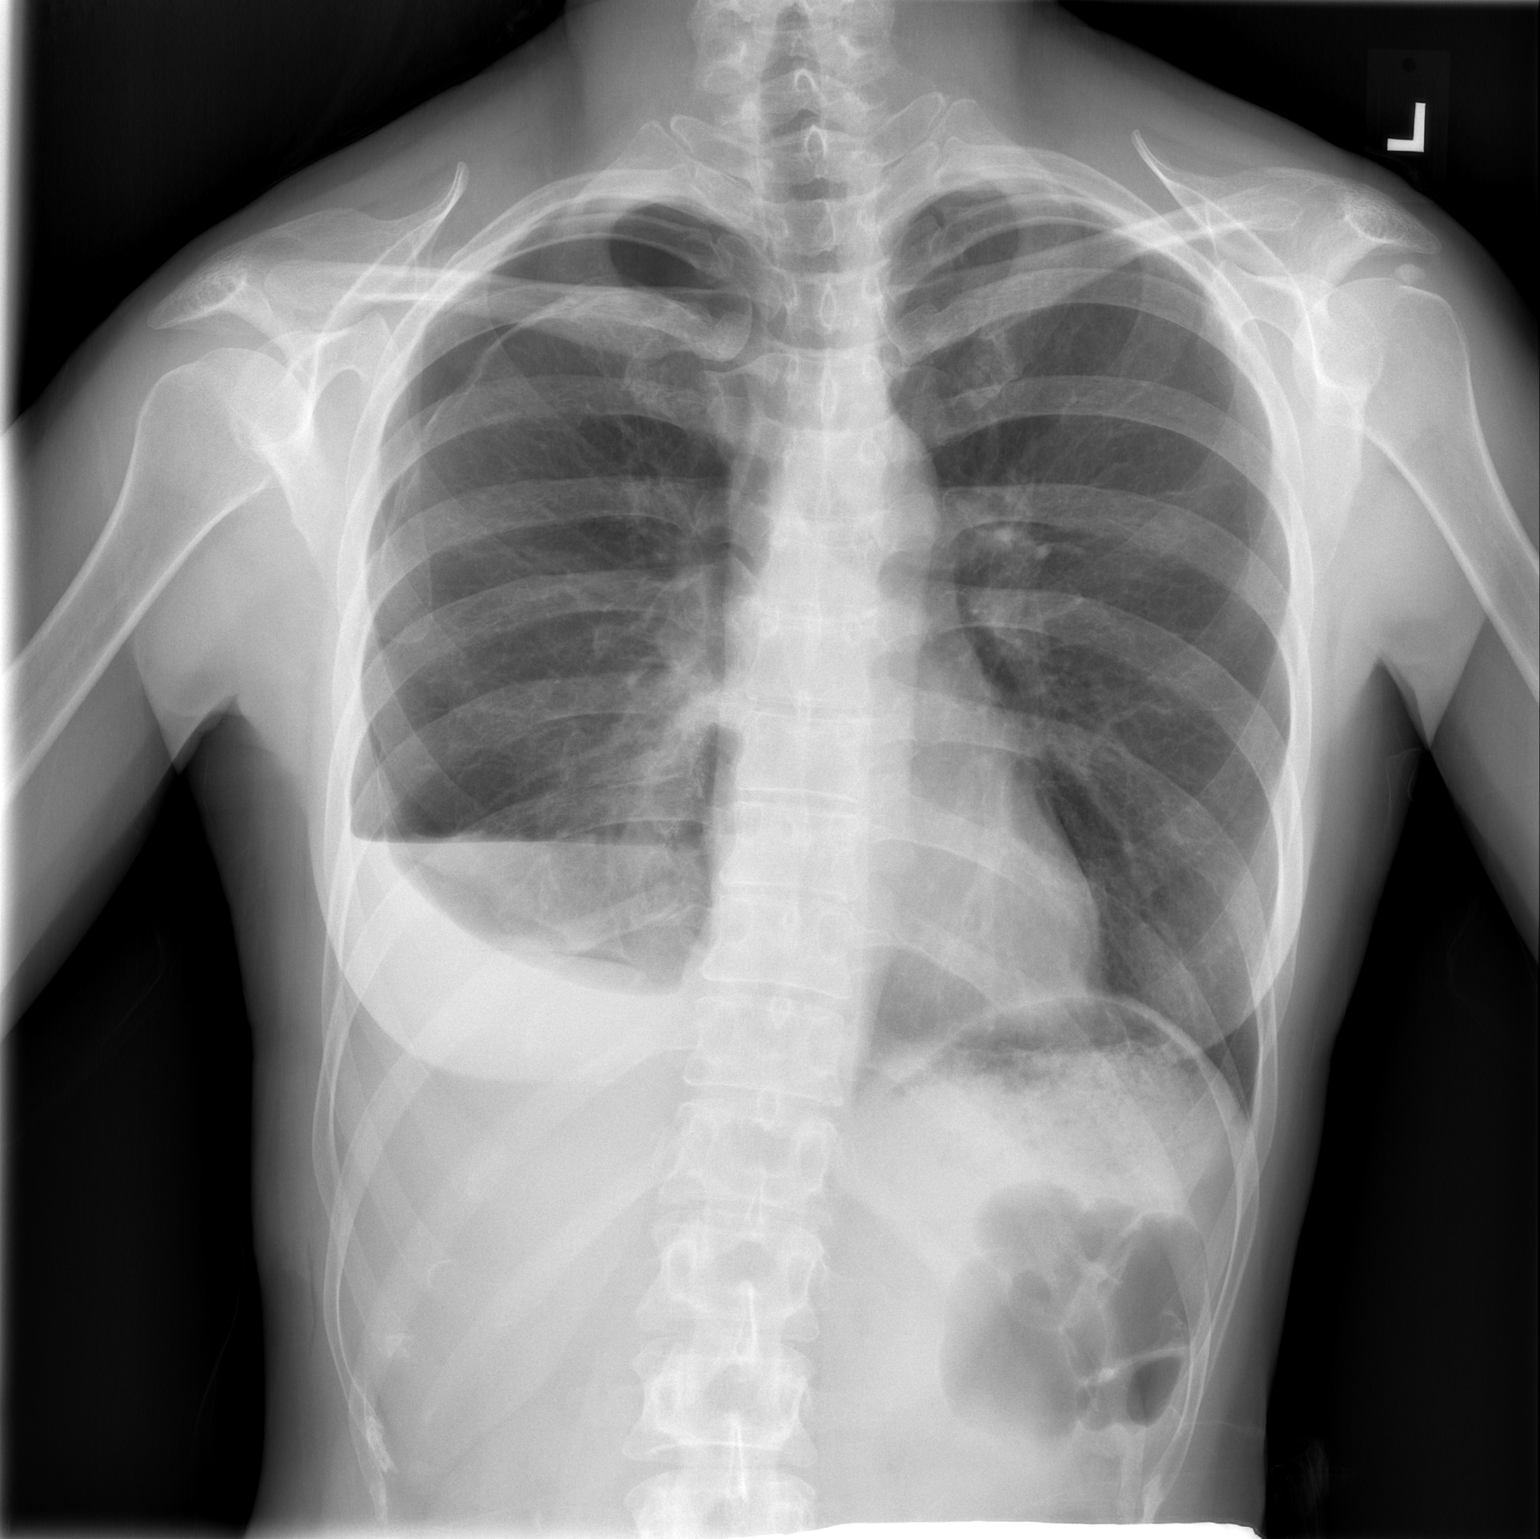

[w chest lat]
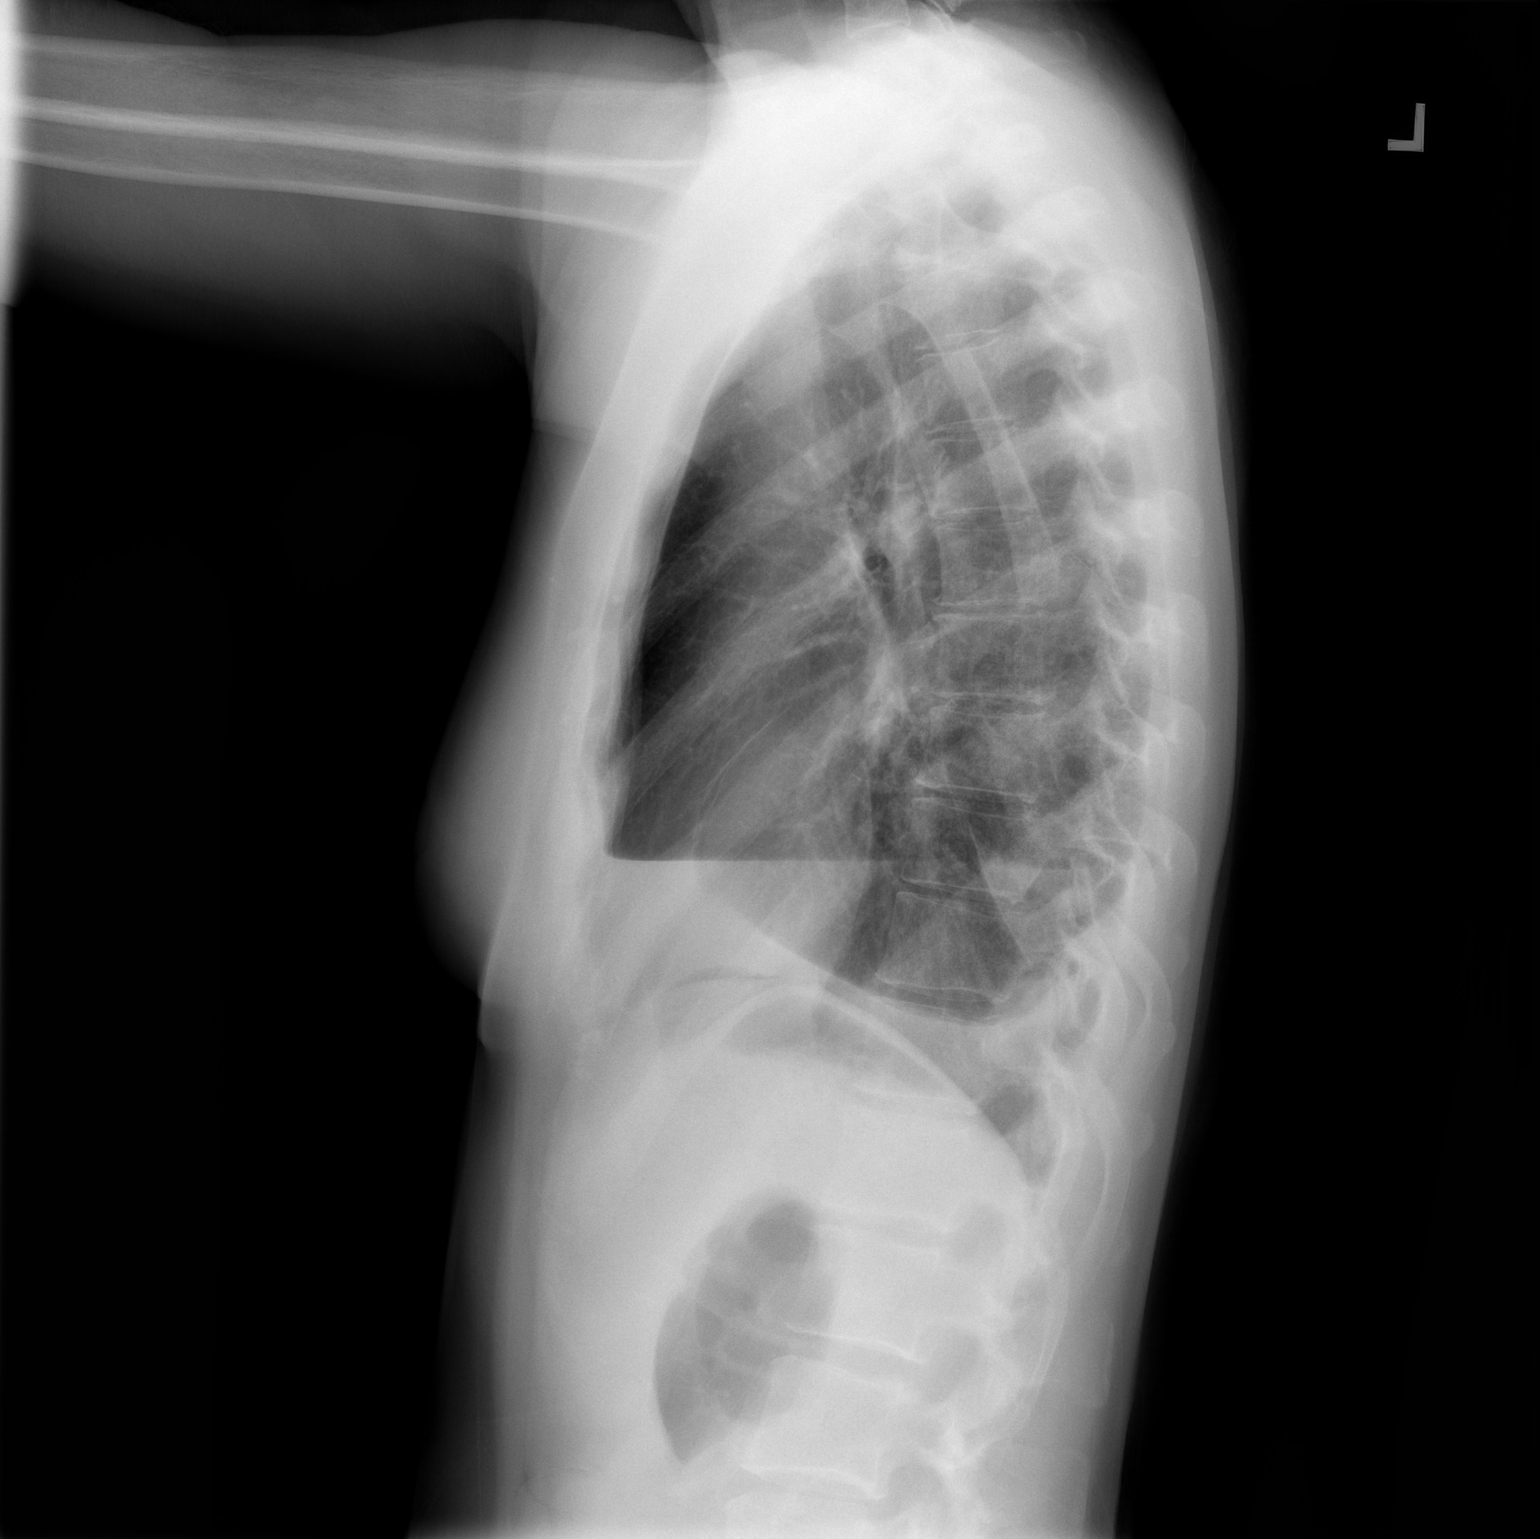

[2 of 2 positions shown; findings below may reference images not displayed]

FINDINGS: Persistent right hydropneumothorax with slight decrease in volume of
pleural gas. Left lung remains clear. The cardiopericardial
silhouette is within normal limits for size. The visualized bony
structures of the thorax show no acute abnormality.
IMPRESSION: Persistent right hydropneumothorax with slight decrease in volume of
pleural gas.

## 2022-06-26 ENCOUNTER — Encounter: Payer: Self-pay | Admitting: Physician Assistant

## 2022-06-26 ENCOUNTER — Ambulatory Visit (INDEPENDENT_AMBULATORY_CARE_PROVIDER_SITE_OTHER): Payer: BC Managed Care – PPO | Admitting: Physician Assistant

## 2022-06-26 DIAGNOSIS — D0430 Carcinoma in situ of skin of unspecified part of face: Secondary | ICD-10-CM

## 2022-06-26 DIAGNOSIS — D0439 Carcinoma in situ of skin of other parts of face: Secondary | ICD-10-CM

## 2022-06-26 NOTE — Patient Instructions (Signed)

## 2022-07-08 ENCOUNTER — Other Ambulatory Visit: Payer: Self-pay

## 2022-07-16 ENCOUNTER — Other Ambulatory Visit: Payer: Self-pay

## 2022-07-16 IMAGING — DX DG CHEST 2V
2 series · 2 of 2 positions shown · non-contrast
Comparison: 05/01/2021

CLINICAL DATA: Right lung cancer.

EXAM:
CHEST - 2 VIEW

[dg chest 2 view (1 of 2)]
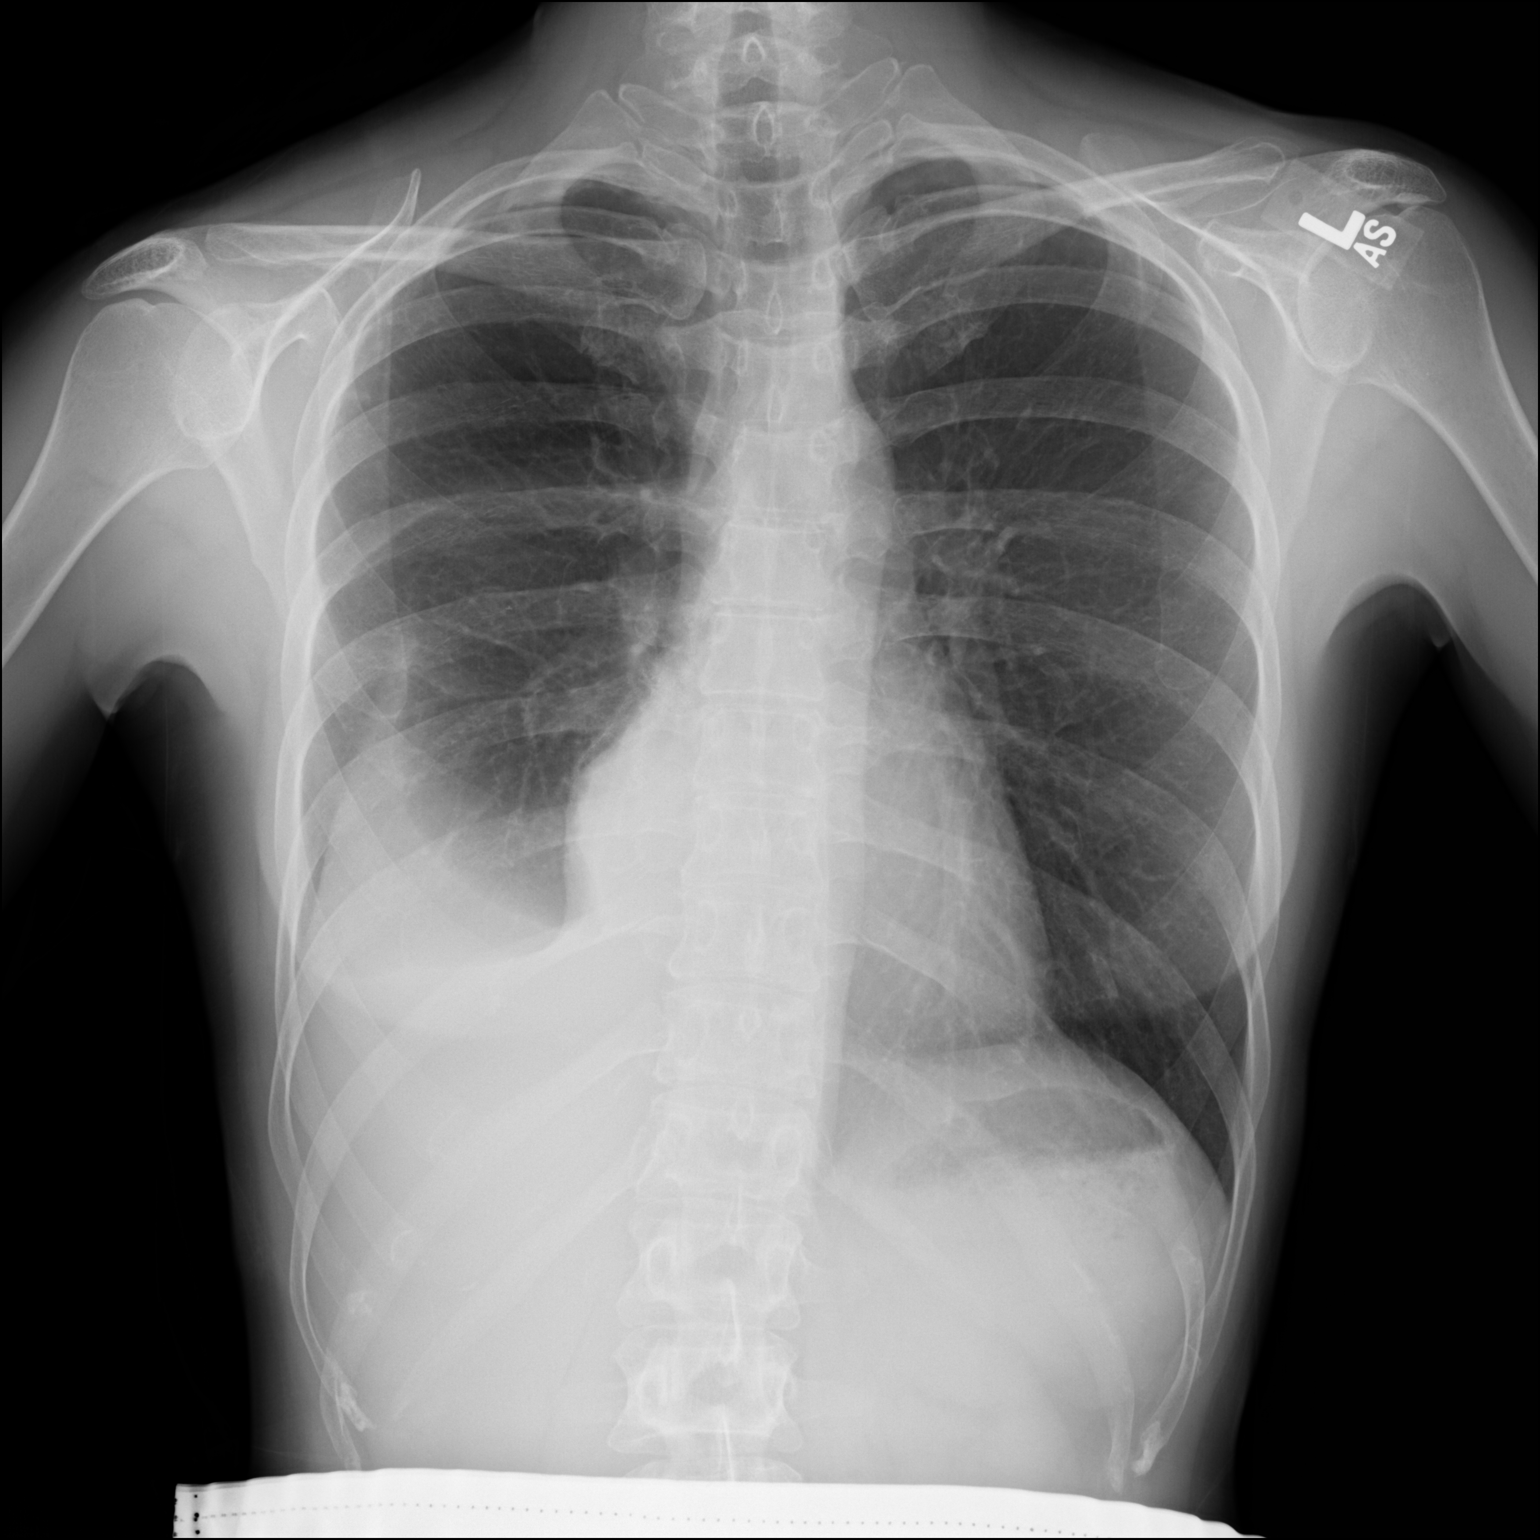

[dg chest 2 view (2 of 2)]
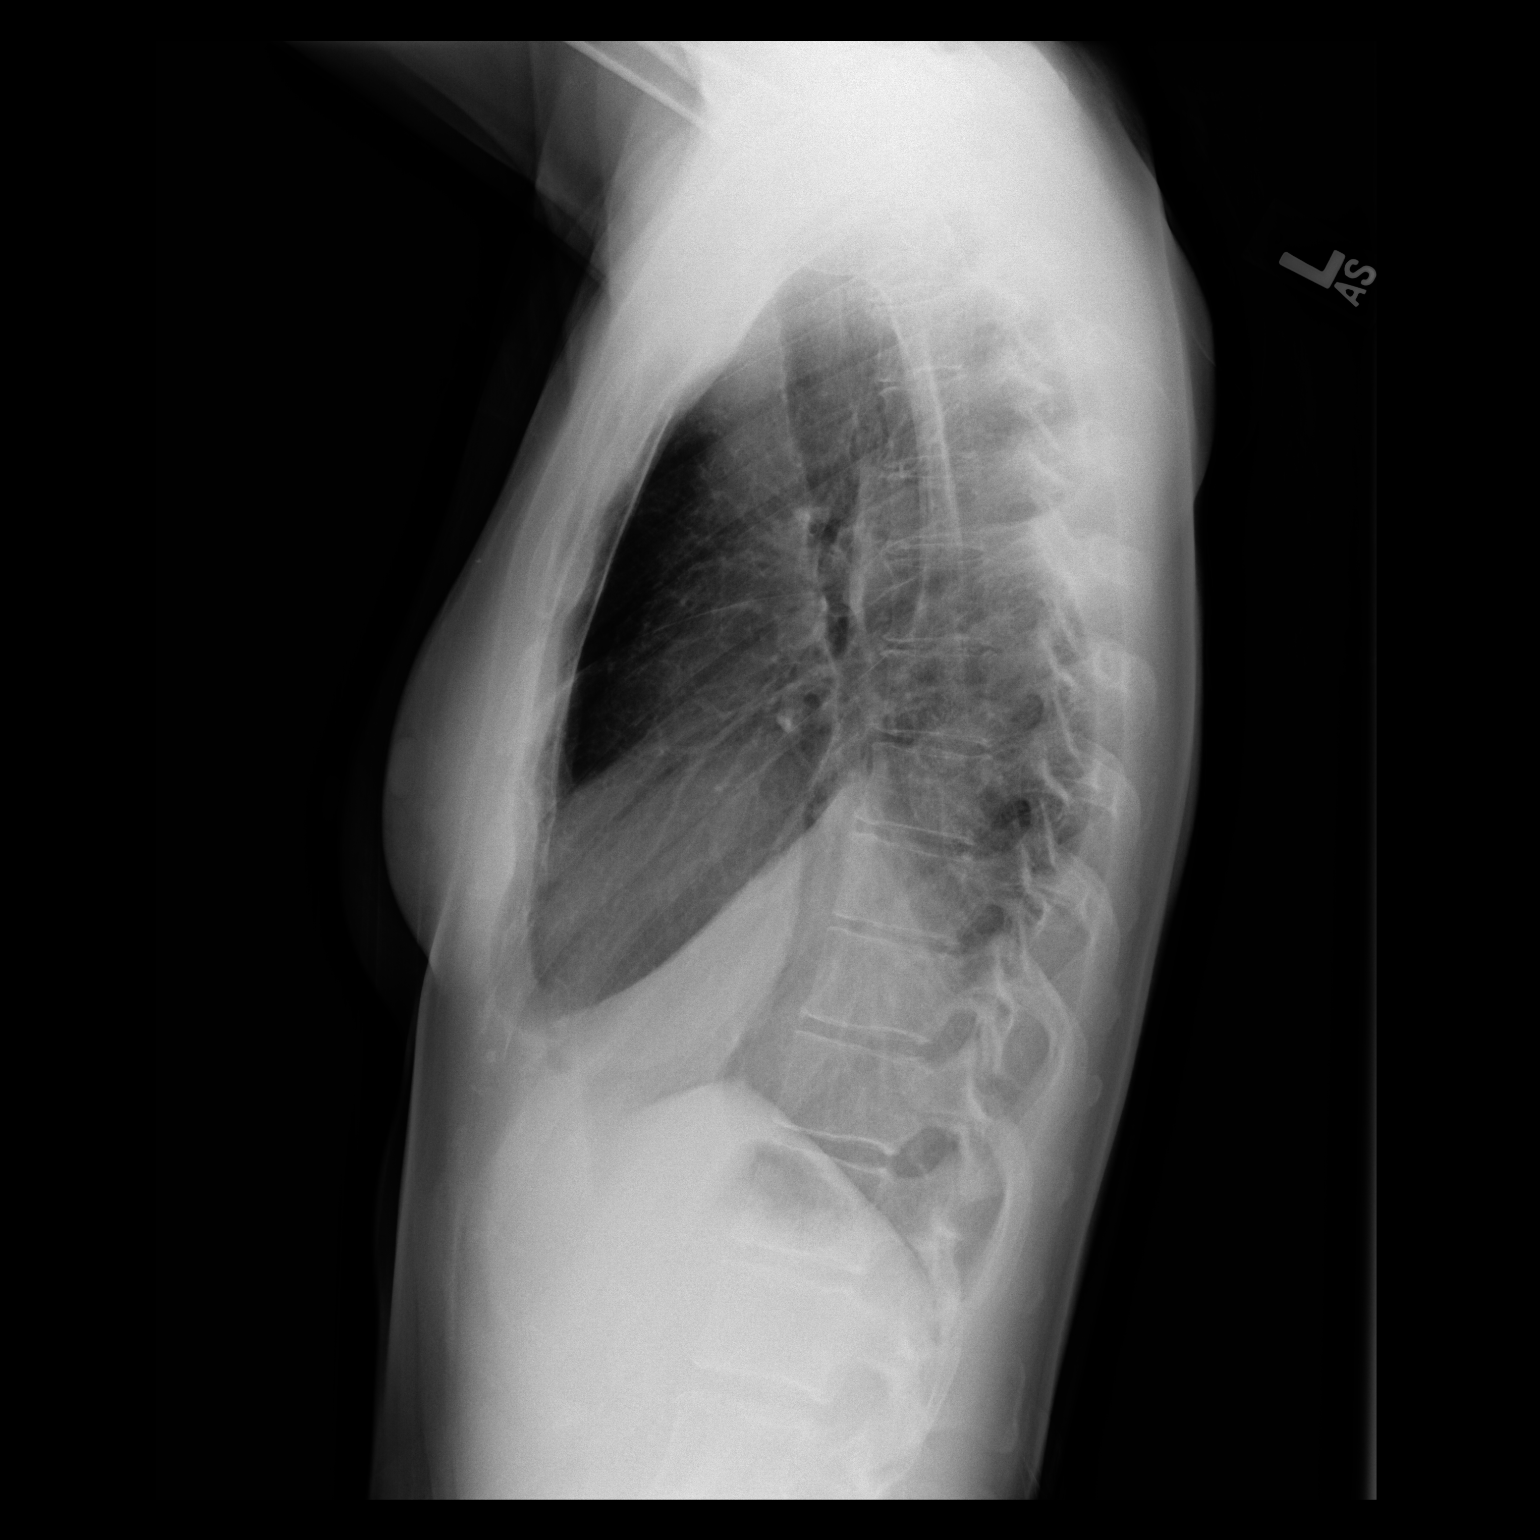

[2 of 2 positions shown; findings below may reference images not displayed]

FINDINGS: Stable normal sized heart. Mild increase in size of a moderate-sized
right pleural effusion. No residual pneumothorax. Clear left lung.
Mild scoliosis.
IMPRESSION: Mild increase in size of a moderate-sized right pleural effusion.

## 2022-07-18 ENCOUNTER — Encounter: Payer: Self-pay | Admitting: Physician Assistant

## 2022-07-18 NOTE — Progress Notes (Signed)
   Follow-Up Visit   Subjective  Kathleen Hughes is a 46 y.o. female who presents for the following: Procedure (Patient here today for treatment of CIS on right chin).   The following portions of the chart were reviewed this encounter and updated as appropriate:  Tobacco  Allergies  Meds  Problems  Med Hx  Surg Hx  Fam Hx      Objective  Well appearing patient in no apparent distress; mood and affect are within normal limits.  A focused examination was performed including face. Relevant physical exam findings are noted in the Assessment and Plan.  Right Chin Pink macule with slight divot. Lesion identified by Robyne Askew, PA and nurse in room.     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  Destruction of lesion Complexity: simple   Destruction method: electrodesiccation and curettage   Informed consent: discussed and consent obtained   Timeout:  patient name, date of birth, surgical site, and procedure verified Anesthesia: the lesion was anesthetized in a standard fashion   Anesthetic:  1% lidocaine w/ epinephrine 1-100,000 local infiltration Curettage performed in three different directions: Yes   Electrodesiccation performed over the curetted area: Yes   Curettage cycles:  3 Margin per side (cm):  0.1 Final wound size (cm):  1 Hemostasis achieved with:  aluminum chloride Outcome: patient tolerated procedure well with no complications   Post-procedure details: wound care instructions given   Additional details:  Wound inoculated with 5% fluorouracil solution    I, Arnitra Sokoloski, PA-C, have reviewed all documentation's for this visit.  The documentation on 07/18/22 for the exam, diagnosis, procedures and orders are all accurate and complete.

## 2022-08-14 ENCOUNTER — Other Ambulatory Visit: Payer: Self-pay | Admitting: Obstetrics & Gynecology

## 2022-08-14 DIAGNOSIS — Z1231 Encounter for screening mammogram for malignant neoplasm of breast: Secondary | ICD-10-CM

## 2022-09-04 ENCOUNTER — Ambulatory Visit
Admission: RE | Admit: 2022-09-04 | Discharge: 2022-09-04 | Disposition: A | Discharge status: Home / Self Care | Payer: BC Managed Care – PPO | Source: Ambulatory Visit | Attending: Obstetrics & Gynecology | Admitting: Obstetrics & Gynecology

## 2022-09-04 DIAGNOSIS — Z1231 Encounter for screening mammogram for malignant neoplasm of breast: Secondary | ICD-10-CM

## 2022-09-06 ENCOUNTER — Telehealth: Payer: Self-pay | Admitting: Adult Health

## 2022-09-06 NOTE — Telephone Encounter (Signed)
Pt informed of mammogram results.

## 2022-09-06 NOTE — Telephone Encounter (Signed)
Please advise pt of mammo results

## 2022-10-07 DIAGNOSIS — F418 Other specified anxiety disorders: Secondary | ICD-10-CM | POA: Diagnosis not present

## 2022-10-07 DIAGNOSIS — C3411 Malignant neoplasm of upper lobe, right bronchus or lung: Secondary | ICD-10-CM | POA: Diagnosis not present

## 2022-10-07 DIAGNOSIS — C3491 Malignant neoplasm of unspecified part of right bronchus or lung: Secondary | ICD-10-CM | POA: Diagnosis not present

## 2022-10-07 DIAGNOSIS — R918 Other nonspecific abnormal finding of lung field: Secondary | ICD-10-CM | POA: Diagnosis not present

## 2022-10-07 DIAGNOSIS — J90 Pleural effusion, not elsewhere classified: Secondary | ICD-10-CM | POA: Diagnosis not present

## 2022-10-28 DIAGNOSIS — Z85118 Personal history of other malignant neoplasm of bronchus and lung: Secondary | ICD-10-CM | POA: Diagnosis not present

## 2022-10-28 DIAGNOSIS — R936 Abnormal findings on diagnostic imaging of limbs: Secondary | ICD-10-CM | POA: Diagnosis not present

## 2022-10-28 DIAGNOSIS — Z6822 Body mass index (BMI) 22.0-22.9, adult: Secondary | ICD-10-CM | POA: Diagnosis not present

## 2022-10-28 DIAGNOSIS — M25512 Pain in left shoulder: Secondary | ICD-10-CM | POA: Diagnosis not present

## 2022-11-01 DIAGNOSIS — M19012 Primary osteoarthritis, left shoulder: Secondary | ICD-10-CM | POA: Diagnosis not present

## 2022-11-01 DIAGNOSIS — Z85118 Personal history of other malignant neoplasm of bronchus and lung: Secondary | ICD-10-CM | POA: Diagnosis not present

## 2022-11-01 DIAGNOSIS — M25512 Pain in left shoulder: Secondary | ICD-10-CM | POA: Diagnosis not present

## 2022-11-01 DIAGNOSIS — R936 Abnormal findings on diagnostic imaging of limbs: Secondary | ICD-10-CM | POA: Diagnosis not present

## 2022-11-01 DIAGNOSIS — M7532 Calcific tendinitis of left shoulder: Secondary | ICD-10-CM | POA: Diagnosis not present

## 2022-11-20 ENCOUNTER — Encounter: Payer: Self-pay | Admitting: Adult Health

## 2022-11-20 ENCOUNTER — Ambulatory Visit (INDEPENDENT_AMBULATORY_CARE_PROVIDER_SITE_OTHER): Payer: BC Managed Care – PPO | Admitting: Adult Health

## 2022-11-20 VITALS — BP 109/76 | HR 76 | Ht 65.0 in | Wt 132.0 lb

## 2022-11-20 DIAGNOSIS — Z1211 Encounter for screening for malignant neoplasm of colon: Secondary | ICD-10-CM

## 2022-11-20 DIAGNOSIS — Z01419 Encounter for gynecological examination (general) (routine) without abnormal findings: Secondary | ICD-10-CM

## 2022-11-20 DIAGNOSIS — Z3041 Encounter for surveillance of contraceptive pills: Secondary | ICD-10-CM

## 2022-11-20 MED ORDER — NORETHINDRONE 0.35 MG PO TABS: 1.0000 | ORAL_TABLET | Freq: Every day | ORAL | 4 refills | Status: DC

## 2022-11-20 NOTE — Progress Notes (Signed)
Patient ID: Kathleen Hughes, female   DOB: 1976/10/14, 46 y.o.   MRN: [id number] History of Present Illness: Kathleen Hughes is a 46 year old white female,married, G1P1 in for a well woman gyn exam.   Last pap was negative HPV and malignancy 09/26/20.  PCP is Dr Quintin Alto   Current Medications, Allergies, Past Medical History, Past Surgical History, Family History and Social History were reviewed in [place] record.     Review of Systems: Patient denies any headaches, hearing loss, fatigue, blurred vision, shortness of breath, chest pain, abdominal pain, problems with bowel movements, urination, or intercourse. No joint pain or mood swings.  No period on norethindrone    Physical Exam:BP 109/76 (BP Location: Left Arm, Patient Position: Sitting, Cuff Size: Normal)   Pulse 76   Ht 5\' 5"  (1.651 m)   Wt 132 lb (59.9 kg)   BMI 21.97 kg/m   General:  Well developed, well nourished, no acute distress Skin:  Warm and dry Neck:  Midline trachea, normal thyroid, good ROM, no lymphadenopathy Lungs; Clear to auscultation bilaterally Breast:  No dominant palpable mass, retraction, or nipple discharge Cardiovascular: Regular rate and rhythm Abdomen:  Soft, non tender, no hepatosplenomegaly Pelvic:  External genitalia is normal in appearance, no lesions.  The vagina is normal in appearance. Urethra has no lesions or masses. The cervix is smooth.  Uterus is felt to be normal size, shape, and contour.  No adnexal masses or tenderness noted.Bladder is non tender, no masses felt. Rectal: Good sphincter tone, no polyps, or hemorrhoids felt.  Hemoccult negative. Extremities/musculoskeletal:  No swelling or varicosities noted, no clubbing or cyanosis Psych:  No mood changes, alert and cooperative,seems happy AA is 0 Fall risk is low    11/20/2022   10:36 AM 11/07/2021    2:01 PM 09/26/2020   10:14 AM  Depression screen PHQ 2/9  Decreased Interest 0 0 0  Down, Depressed, Hopeless  0 0 0  PHQ - 2 Score 0 0 0  Altered sleeping 0 0 1  Tired, decreased energy 0 0 0  Change in appetite 0 0 1  Feeling bad or failure about yourself  0 0 0  Trouble concentrating 0 0 0  Moving slowly or fidgety/restless 0 0 0  Suicidal thoughts 0 0 0  PHQ-9 Score 0 0 2       11/20/2022   10:36 AM 11/07/2021    2:01 PM 09/26/2020   10:14 AM  GAD 7 : Generalized Anxiety Score  Nervous, Anxious, on Edge 1 0 0  Control/stop worrying 1 0 1  Worry too much - different things 1 0 0  Trouble relaxing 1 0 1  Restless 1 0 0  Easily annoyed or irritable 1 0 0  Afraid - awful might happen 1 0 1  Total GAD 7 Score 7 0 3  Has atarax as needed   Upstream - 11/20/22 1036       Pregnancy Intention Screening   Does the patient want to become pregnant in the next year? No    Does the patient's partner want to become pregnant in the next year? No    Would the patient like to discuss contraceptive options today? No      Contraception Wrap Up   Current Method Oral Contraceptive    End Method Oral Contraceptive    Contraception Counseling Provided No              Examination chaperoned by Celene Squibb LPN  Impression and Plan: 1. Encounter for well woman exam with routine gynecological exam Pap and physical in 1 year Had negative mammogram  09/04/22 Colonoscopy per GI Labs with PCP She gets CT of chest every 6 months has history of lung cancer   2. Encounter for surveillance of contraceptive pills Will continue  Meds ordered this encounter  Medications   norethindrone ([name]) 0.35 MG tablet    Sig: Take 1 tablet (0.35 mg total) by mouth daily.    Dispense:  84 tablet    Refill:  4    Order Specific Question:   Supervising Provider    Answer:   Elonda Husky, LUTHER H [[id number]]     3. Encounter for screening fecal occult blood testing Hemoccult was negative

## 2023-03-18 ENCOUNTER — Ambulatory Visit: Payer: BC Managed Care – PPO | Admitting: Physician Assistant

## 2023-08-26 ENCOUNTER — Other Ambulatory Visit: Payer: Self-pay | Admitting: Obstetrics & Gynecology

## 2023-08-26 DIAGNOSIS — Z1231 Encounter for screening mammogram for malignant neoplasm of breast: Secondary | ICD-10-CM

## 2023-08-27 ENCOUNTER — Other Ambulatory Visit: Payer: Self-pay

## 2023-09-12 ENCOUNTER — Ambulatory Visit: Payer: BC Managed Care – PPO

## 2023-09-12 ENCOUNTER — Ambulatory Visit
Admission: RE | Admit: 2023-09-12 | Discharge: 2023-09-12 | Disposition: A | Discharge status: Home / Self Care | Payer: BC Managed Care – PPO | Source: Ambulatory Visit | Attending: Obstetrics & Gynecology | Admitting: Obstetrics & Gynecology

## 2023-09-12 DIAGNOSIS — Z1231 Encounter for screening mammogram for malignant neoplasm of breast: Secondary | ICD-10-CM

## 2023-11-22 ENCOUNTER — Other Ambulatory Visit: Payer: Self-pay

## 2024-01-22 ENCOUNTER — Other Ambulatory Visit: Payer: Self-pay | Admitting: Adult Health

## 2024-08-13 ENCOUNTER — Other Ambulatory Visit: Payer: Self-pay | Admitting: Obstetrics and Gynecology

## 2024-08-13 DIAGNOSIS — Z1231 Encounter for screening mammogram for malignant neoplasm of breast: Secondary | ICD-10-CM

## 2024-08-14 ENCOUNTER — Other Ambulatory Visit: Payer: Self-pay

## 2024-09-29 ENCOUNTER — Ambulatory Visit

## 2024-09-30 ENCOUNTER — Other Ambulatory Visit: Payer: Self-pay

## 2024-10-19 ENCOUNTER — Ambulatory Visit

## 2024-10-19 ENCOUNTER — Other Ambulatory Visit: Payer: Self-pay

## 2024-11-03 ENCOUNTER — Ambulatory Visit
Admission: RE | Admit: 2024-11-03 | Discharge: 2024-11-03 | Disposition: A | Source: Ambulatory Visit | Attending: Obstetrics and Gynecology | Admitting: Obstetrics and Gynecology

## 2024-11-03 DIAGNOSIS — Z1231 Encounter for screening mammogram for malignant neoplasm of breast: Secondary | ICD-10-CM

## 2024-11-04 ENCOUNTER — Other Ambulatory Visit: Payer: Self-pay

## 2024-12-30 ENCOUNTER — Other Ambulatory Visit: Payer: Self-pay | Admitting: Adult Health
